# Patient Record
Sex: Female | Born: 1970 | Hispanic: Refuse to answer | Smoking: Never smoker
Health system: Southern US, Community
[De-identification: ages and names within clinical notes are randomized; demographics above are authoritative.]

## PROBLEM LIST (undated history)

## (undated) DIAGNOSIS — R0609 Other forms of dyspnea: Secondary | ICD-10-CM

## (undated) DIAGNOSIS — N809 Endometriosis, unspecified: Secondary | ICD-10-CM

---

## 2018-03-29 DIAGNOSIS — A159 Respiratory tuberculosis unspecified: Secondary | ICD-10-CM

## 2018-03-29 DIAGNOSIS — Z8611 Personal history of tuberculosis: Secondary | ICD-10-CM

## 2018-03-29 HISTORY — DX: Respiratory tuberculosis unspecified: A15.9

## 2018-03-29 HISTORY — DX: Personal history of tuberculosis: Z86.11

## 2018-11-01 ENCOUNTER — Other Ambulatory Visit: Payer: Self-pay | Admitting: Nurse Practitioner

## 2018-11-01 DIAGNOSIS — Z1231 Encounter for screening mammogram for malignant neoplasm of breast: Secondary | ICD-10-CM

## 2018-12-15 ENCOUNTER — Other Ambulatory Visit: Payer: Self-pay

## 2018-12-15 ENCOUNTER — Ambulatory Visit
Admission: RE | Admit: 2018-12-15 | Discharge: 2018-12-15 | Disposition: A | Discharge status: Home / Self Care | Payer: BC Managed Care – PPO | Source: Ambulatory Visit | Attending: Nurse Practitioner | Admitting: Nurse Practitioner

## 2018-12-15 DIAGNOSIS — Z1231 Encounter for screening mammogram for malignant neoplasm of breast: Secondary | ICD-10-CM

## 2019-07-02 ENCOUNTER — Other Ambulatory Visit: Payer: Self-pay | Admitting: Nurse Practitioner

## 2019-07-02 DIAGNOSIS — R102 Pelvic and perineal pain: Secondary | ICD-10-CM

## 2019-07-05 ENCOUNTER — Ambulatory Visit
Admission: RE | Admit: 2019-07-05 | Discharge: 2019-07-05 | Disposition: A | Discharge status: Home / Self Care | Payer: BC Managed Care – PPO | Source: Ambulatory Visit | Attending: Nurse Practitioner | Admitting: Nurse Practitioner

## 2019-07-05 DIAGNOSIS — R102 Pelvic and perineal pain: Secondary | ICD-10-CM

## 2020-03-05 ENCOUNTER — Other Ambulatory Visit: Payer: Self-pay | Admitting: Physician Assistant

## 2020-03-05 DIAGNOSIS — Z1231 Encounter for screening mammogram for malignant neoplasm of breast: Secondary | ICD-10-CM

## 2020-03-29 DIAGNOSIS — D649 Anemia, unspecified: Secondary | ICD-10-CM

## 2020-03-29 HISTORY — DX: Anemia, unspecified: D64.9

## 2020-04-16 ENCOUNTER — Ambulatory Visit: Payer: BC Managed Care – PPO

## 2020-05-01 IMAGING — MG MM DIGITAL SCREENING BILAT W/ TOMO W/ CAD
6 of 12 series · 6 of 36 positions shown · non-contrast
Comparison: None.

CLINICAL DATA: Screening.

EXAM:
DIGITAL SCREENING BILATERAL MAMMOGRAM WITH TOMO AND CAD

[R CC synth-2D (1 of 2)]
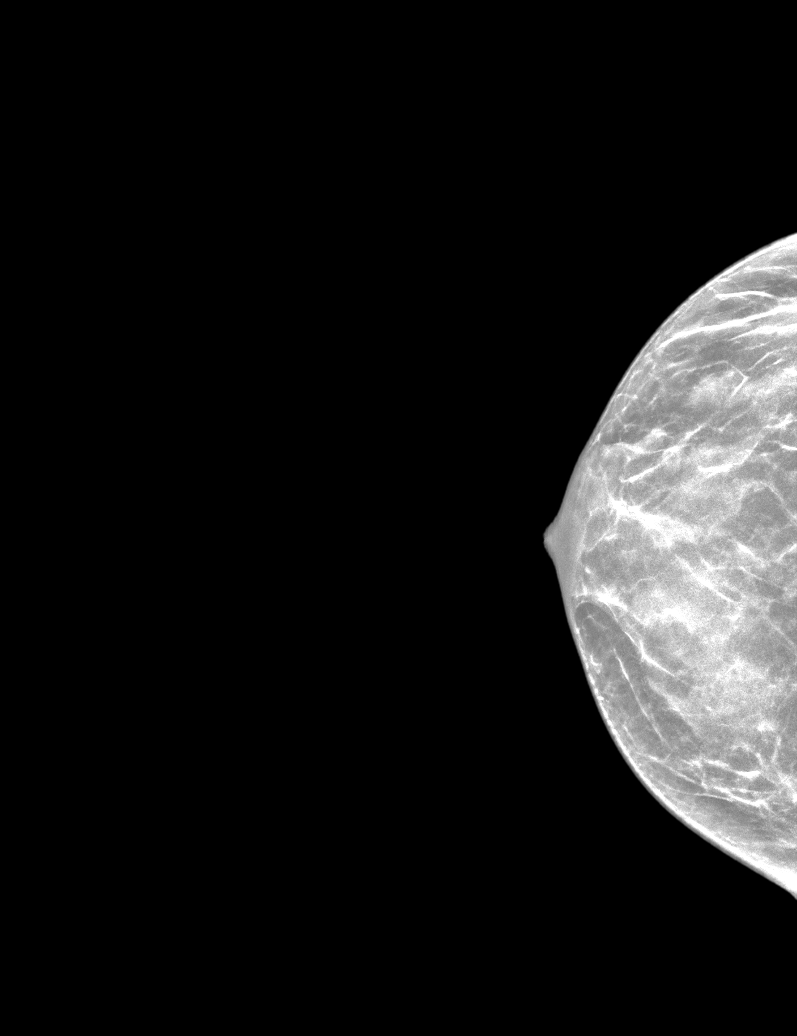

[L CC synth-2D (1 of 2)]
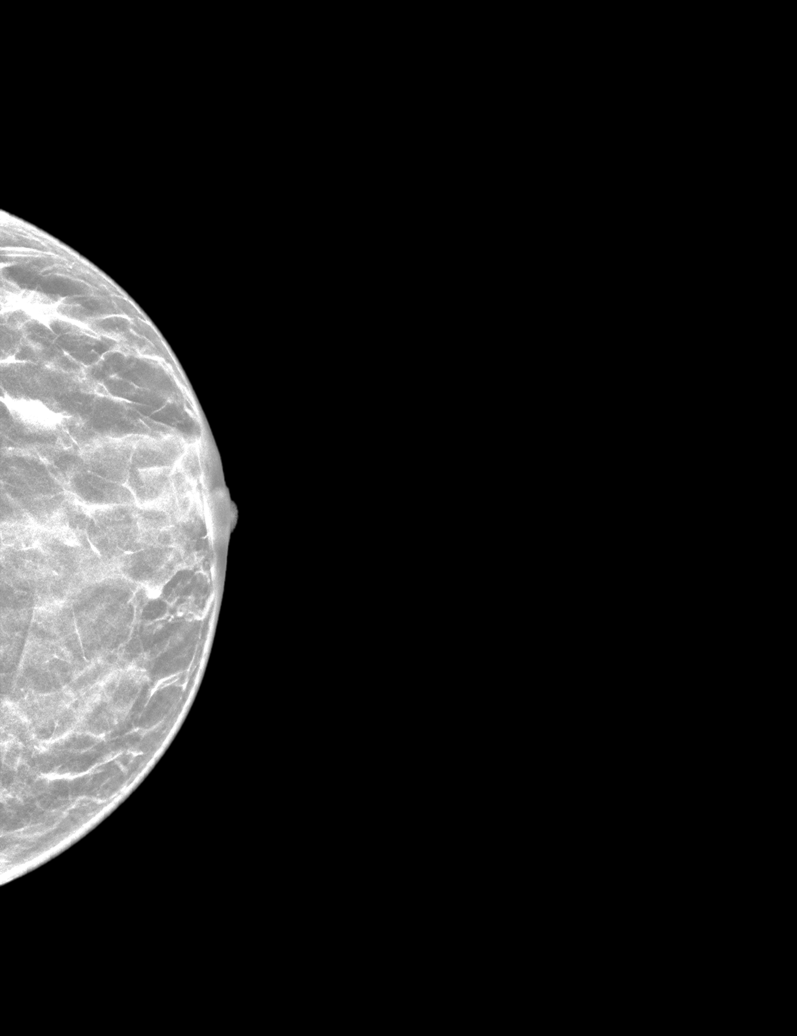

[R MLO synth-2D]
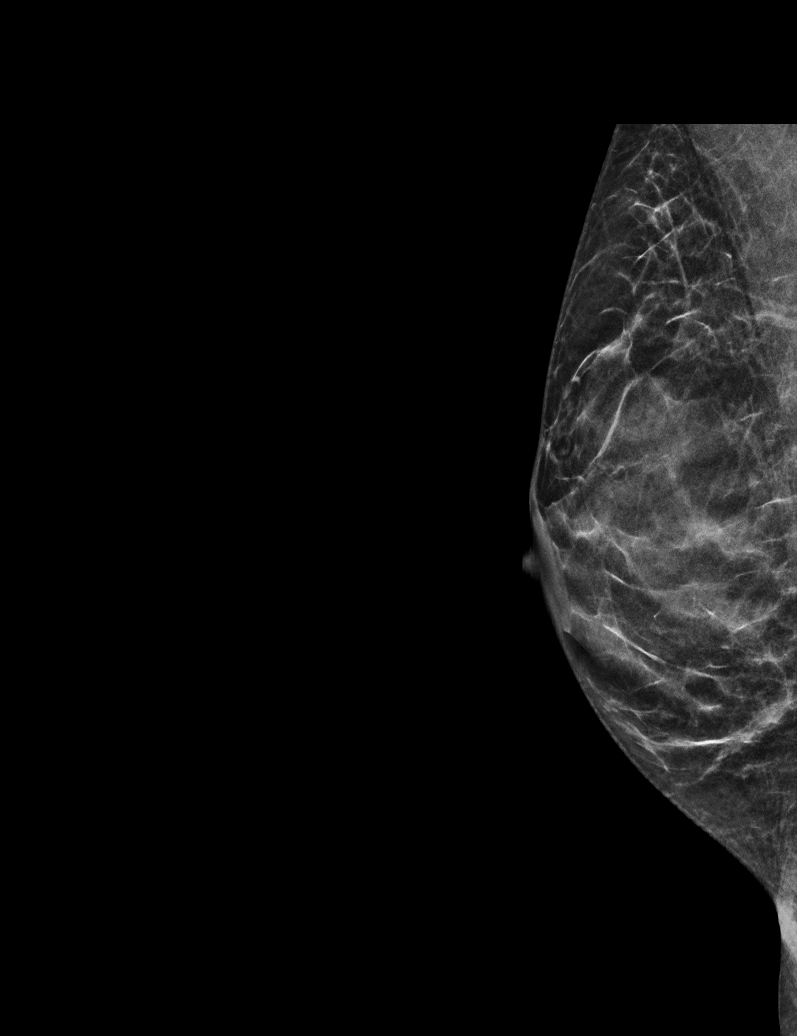

[L CC synth-2D (2 of 2)]
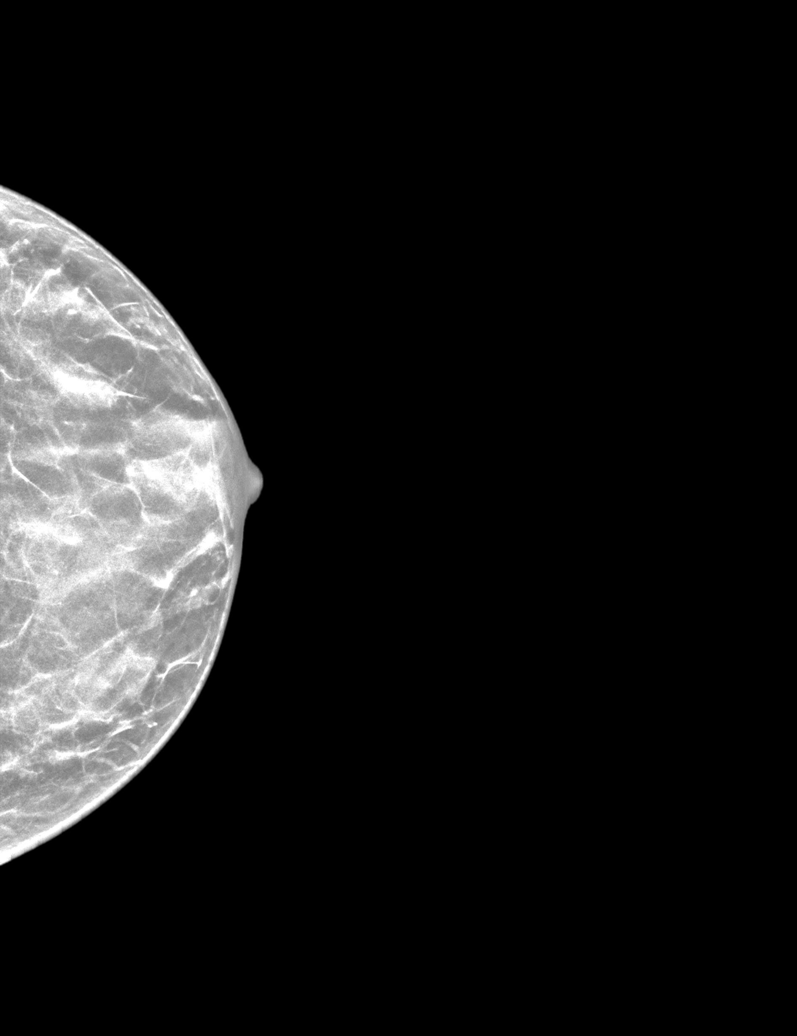

[R CC synth-2D (2 of 2)]
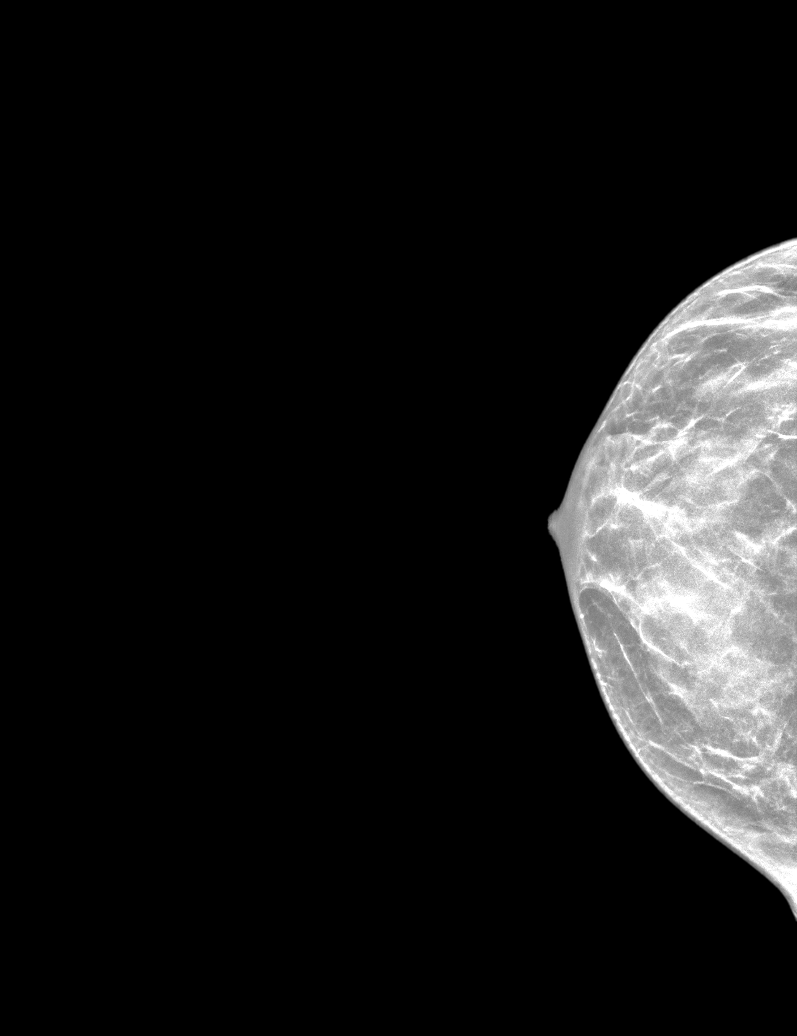

[L MLO synth-2D]
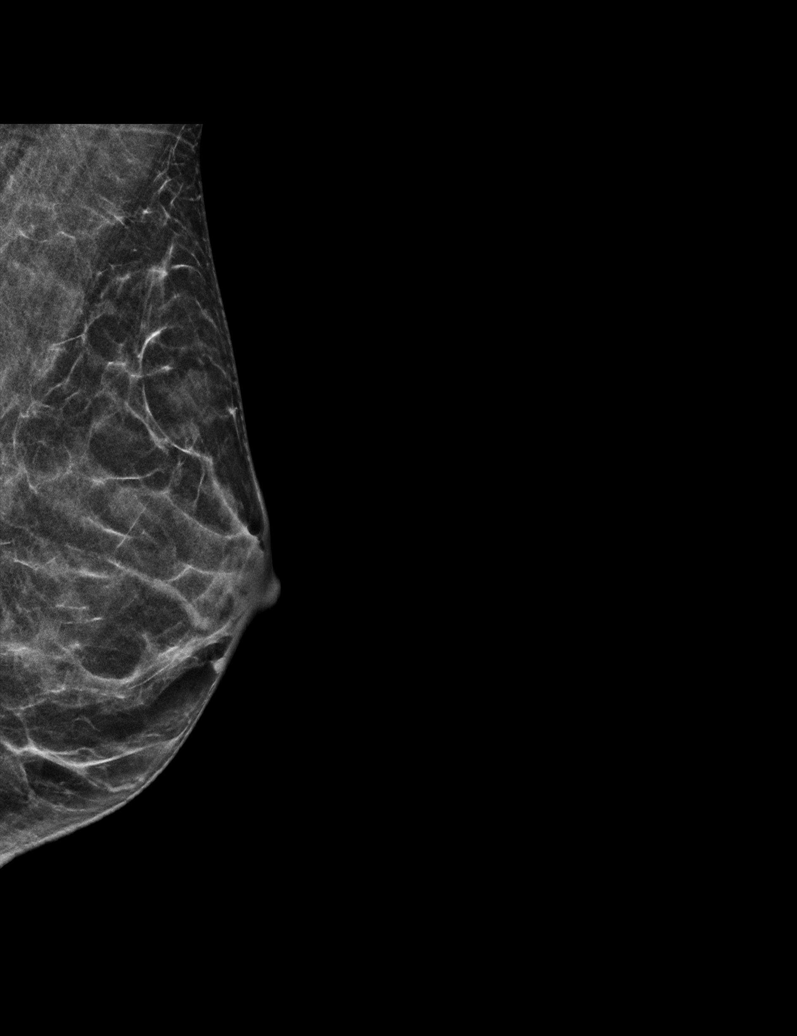

[6 of 36 positions shown; findings below may reference images not displayed]

ACR Breast Density Category c: The breast tissue is heterogeneously
dense, which may obscure small masses
FINDINGS: There are no findings suspicious for malignancy. Images were
processed with CAD.
IMPRESSION: No mammographic evidence of malignancy. A result letter of this
screening mammogram will be mailed directly to the patient.

RECOMMENDATION:
Screening mammogram in one year. (Code:EM-2-IHY)

BI-RADS CATEGORY  1: Negative.

## 2020-05-27 ENCOUNTER — Ambulatory Visit
Admission: RE | Admit: 2020-05-27 | Discharge: 2020-05-27 | Disposition: A | Discharge status: Home / Self Care | Payer: BC Managed Care – PPO | Source: Ambulatory Visit | Attending: Physician Assistant | Admitting: Physician Assistant

## 2020-05-27 ENCOUNTER — Other Ambulatory Visit: Payer: Self-pay

## 2020-05-27 DIAGNOSIS — Z1231 Encounter for screening mammogram for malignant neoplasm of breast: Secondary | ICD-10-CM

## 2020-11-19 IMAGING — US US PELVIS COMPLETE WITH TRANSVAGINAL
1 series · 13 of 25 positions shown · non-contrast
Comparison: None

CLINICAL DATA: Pelvic pain in a female, RIGHT lower quadrant pain
for a month; LMP 05/23/2019



[Series 1: us pelvis complete with transvaginal · 0.25mm/px · 13 of 81 slices shown]
[im 1/81]
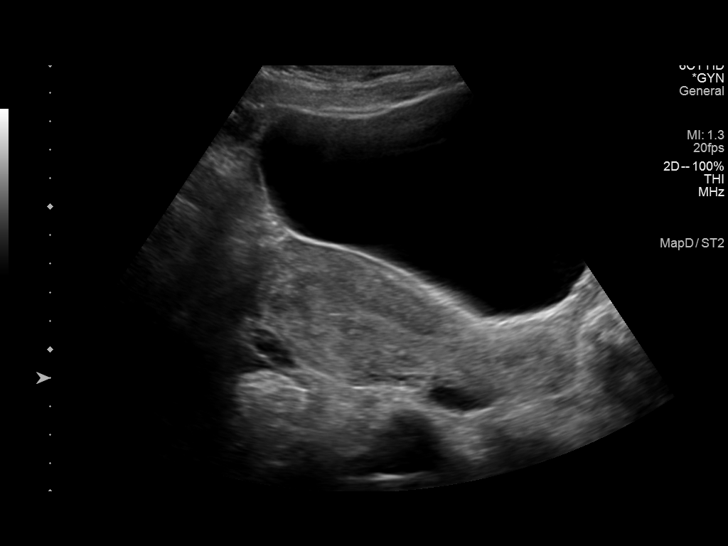
[im 7/81]
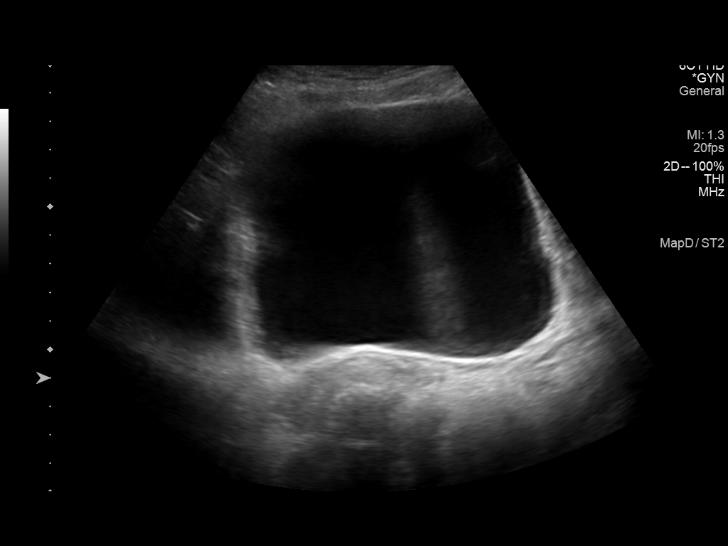
[im 14/81]
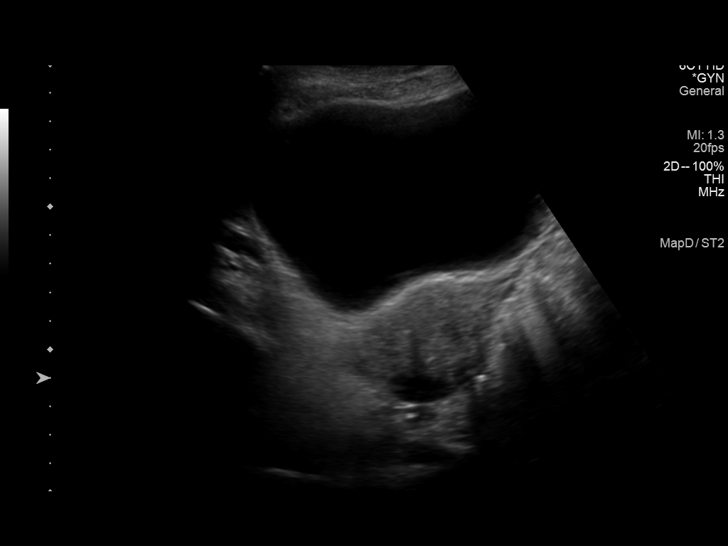
[im 21/81]
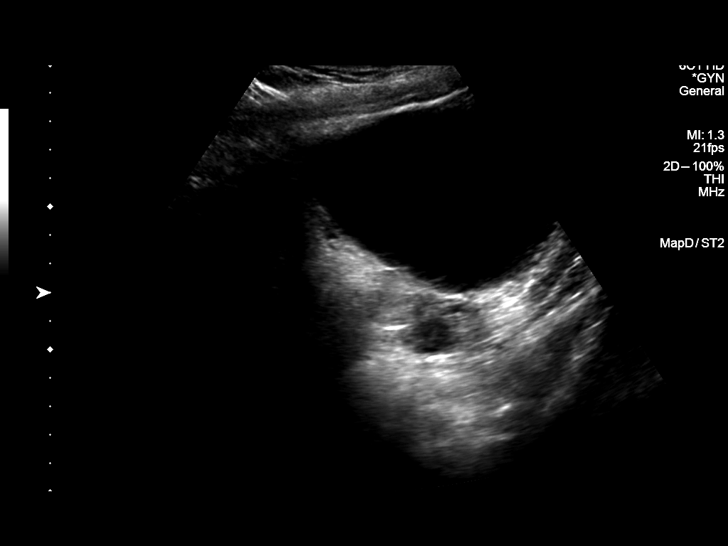
[im 27/81]
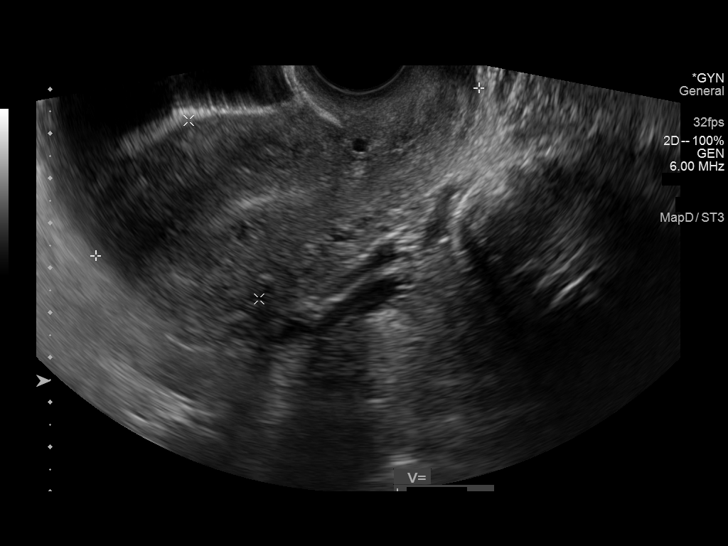
[im 34/81]
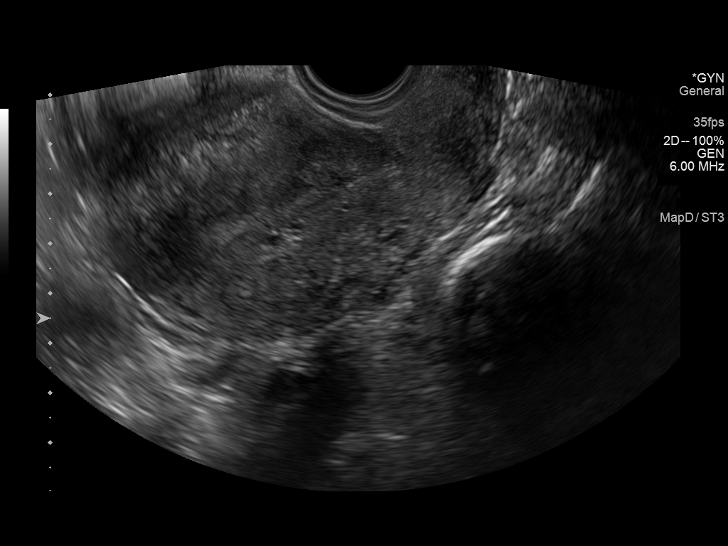
[im 41/81]
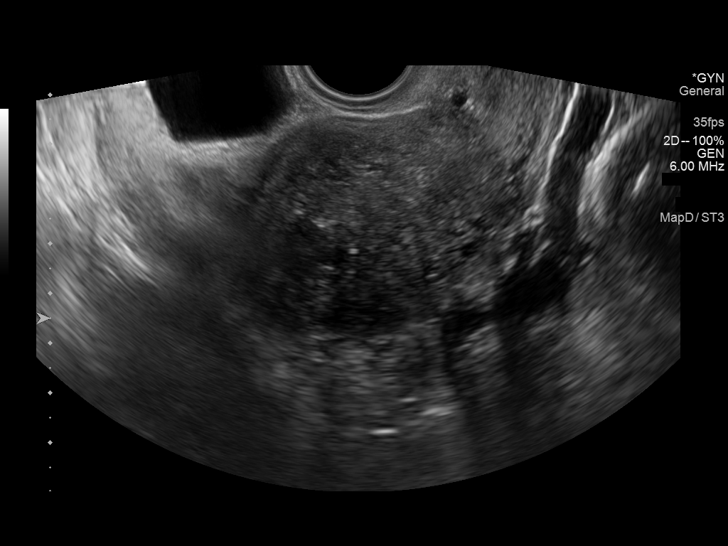
[im 47/81]
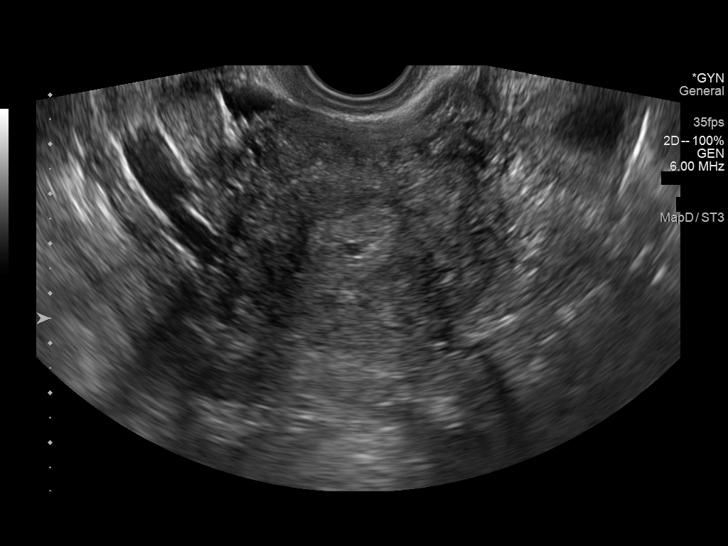
[im 54/81]
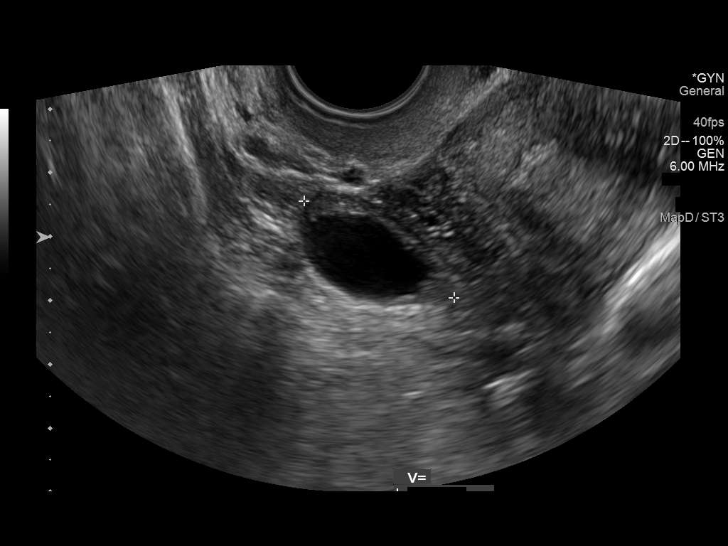
[im 61/81]
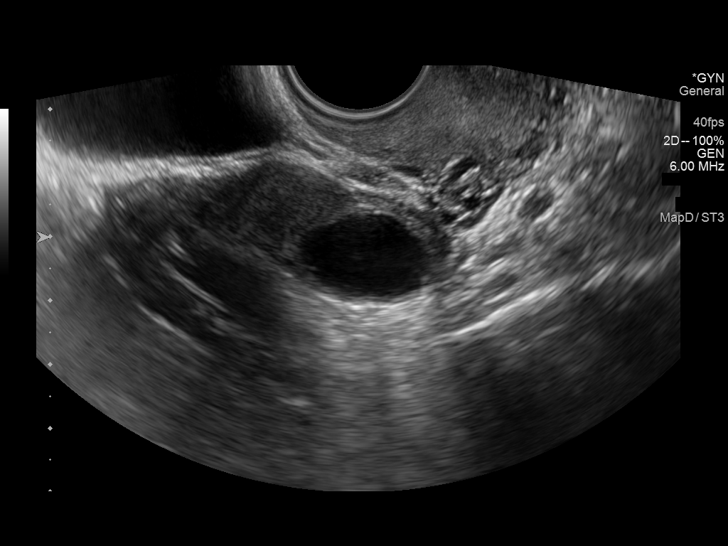
[im 67/81]
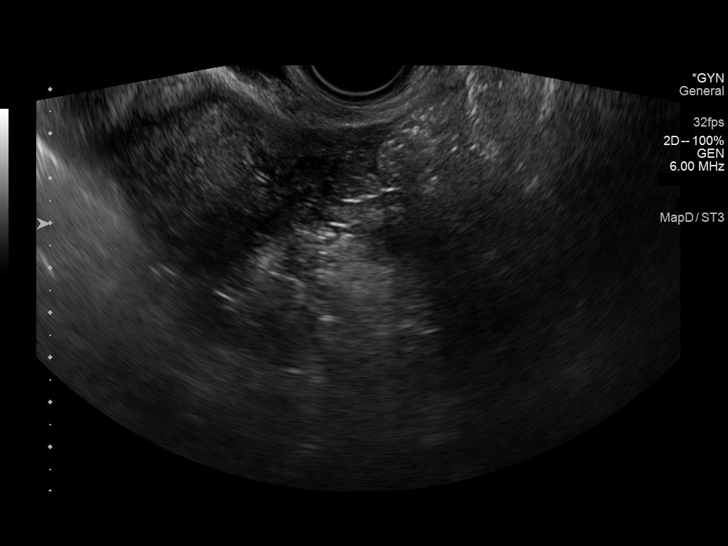
[im 74/81]
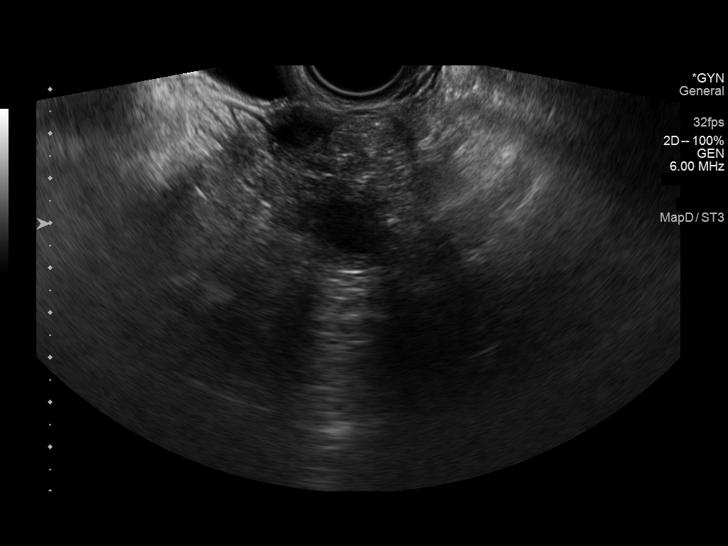
[im 81/81]
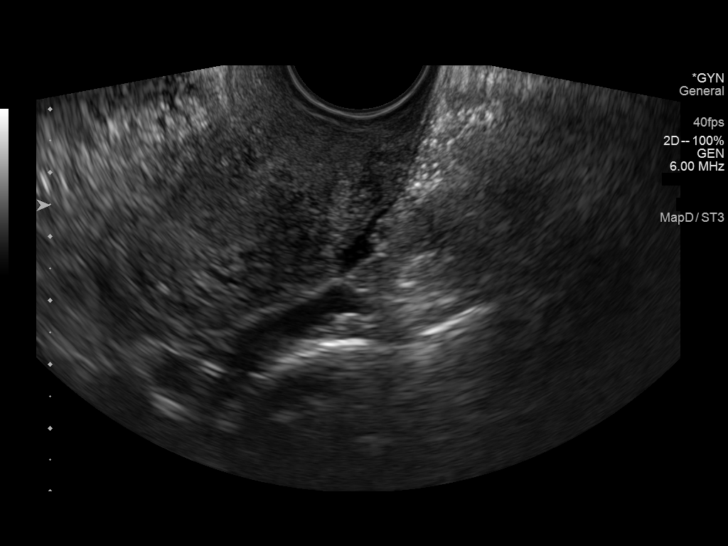

[13 of 25 positions shown; findings below may reference images not displayed]

FINDINGS: Uterus

Measurements: 11.6 x 4.2 x 6.7 cm = volume: 172 mL. Mildly
heterogeneous myometrium. Small intramural leiomyoma identified at
fundus 1.3 x 1.4 x 1.2 cm. No additional masses.

Endometrium

Thickness: 12 mm. Two tiny cysts within endometrial complex up to 3
mm diameter. No other endometrial fluid or focal mass.

Right ovary

Measurements: 3.5 x 1.8 x 2.8 cm = volume: 5.4 mL. Dominant follicle
without mass

Left ovary

Measurements: 2.1 x 1.7 x 2.4 cm = volume: 4.5 mL. Dominant follicle
without mass

Other findings

No free pelvic fluid or adnexal masses.
IMPRESSION: Single small intramural leiomyoma at upper uterus 1.4 cm greatest
size.

Two tiny nonspecific endometrial cysts.

Remainder of exam unremarkable.

## 2021-03-18 ENCOUNTER — Other Ambulatory Visit (HOSPITAL_BASED_OUTPATIENT_CLINIC_OR_DEPARTMENT_OTHER): Payer: Self-pay

## 2021-03-18 ENCOUNTER — Encounter (HOSPITAL_BASED_OUTPATIENT_CLINIC_OR_DEPARTMENT_OTHER): Payer: Self-pay

## 2021-03-18 ENCOUNTER — Emergency Department (HOSPITAL_BASED_OUTPATIENT_CLINIC_OR_DEPARTMENT_OTHER): Payer: BC Managed Care – PPO | Admitting: Radiology

## 2021-03-18 ENCOUNTER — Other Ambulatory Visit: Payer: Self-pay

## 2021-03-18 ENCOUNTER — Emergency Department (HOSPITAL_BASED_OUTPATIENT_CLINIC_OR_DEPARTMENT_OTHER)
Admission: EM | Admit: 2021-03-18 | Discharge: 2021-03-18 | Disposition: A | Discharge status: Home / Self Care | Payer: BC Managed Care – PPO | Attending: Emergency Medicine | Admitting: Emergency Medicine

## 2021-03-18 DIAGNOSIS — N898 Other specified noninflammatory disorders of vagina: Secondary | ICD-10-CM | POA: Insufficient documentation

## 2021-03-18 DIAGNOSIS — J029 Acute pharyngitis, unspecified: Secondary | ICD-10-CM | POA: Diagnosis not present

## 2021-03-18 DIAGNOSIS — U071 COVID-19: Secondary | ICD-10-CM | POA: Insufficient documentation

## 2021-03-18 DIAGNOSIS — R059 Cough, unspecified: Secondary | ICD-10-CM | POA: Diagnosis not present

## 2021-03-18 HISTORY — DX: Endometriosis, unspecified: N80.9

## 2021-03-18 LAB — COMPREHENSIVE METABOLIC PANEL
ALT: 14 U/L (ref 0–44)
AST: 21 U/L (ref 15–41)
Albumin: 4.2 g/dL (ref 3.5–5.0)
Alkaline Phosphatase: 165 U/L — ABNORMAL HIGH (ref 38–126)
Anion gap: 8 (ref 5–15)
BUN: 6 mg/dL (ref 6–20)
CO2: 24 mmol/L (ref 22–32)
Calcium: 8.8 mg/dL — ABNORMAL LOW (ref 8.9–10.3)
Chloride: 104 mmol/L (ref 98–111)
Creatinine, Ser: 0.7 mg/dL (ref 0.44–1.00)
GFR, Estimated: >60 mL/min (ref >60)
Glucose, Bld: 85 mg/dL (ref 70–99)
Potassium: 3.7 mmol/L (ref 3.5–5.1)
Sodium: 136 mmol/L (ref 135–145)
Total Bilirubin: 0.3 mg/dL (ref 0.3–1.2)
Total Protein: 7.5 g/dL (ref 6.5–8.1)

## 2021-03-18 LAB — URINALYSIS, ROUTINE W REFLEX MICROSCOPIC
Bilirubin Urine: NEGATIVE
Glucose, UA: NEGATIVE mg/dL
Hgb urine dipstick: NEGATIVE
Ketones, ur: NEGATIVE mg/dL
Leukocytes,Ua: NEGATIVE
Nitrite: NEGATIVE
Protein, ur: NEGATIVE mg/dL
Specific Gravity, Urine: 1.005 (ref 1.005–1.030)
pH: 6 (ref 5.0–8.0)

## 2021-03-18 LAB — CBC WITH DIFFERENTIAL/PLATELET
Abs Immature Granulocytes: 0.02 10*3/uL (ref 0.00–0.07)
Basophils Absolute: 0 10*3/uL (ref 0.0–0.1)
Basophils Relative: 1 %
Eosinophils Absolute: 0 10*3/uL (ref 0.0–0.5)
Eosinophils Relative: 0 %
HCT: 35.6 % — ABNORMAL LOW (ref 36.0–46.0)
Hemoglobin: 11.5 g/dL — ABNORMAL LOW (ref 12.0–15.0)
Immature Granulocytes: 0 %
Lymphocytes Relative: 9 %
Lymphs Abs: 0.5 10*3/uL — ABNORMAL LOW (ref 0.7–4.0)
MCH: 28.1 pg (ref 26.0–34.0)
MCHC: 32.3 g/dL (ref 30.0–36.0)
MCV: 87 fL (ref 80.0–100.0)
Monocytes Absolute: 0.7 10*3/uL (ref 0.1–1.0)
Monocytes Relative: 14 %
Neutro Abs: 3.8 10*3/uL (ref 1.7–7.7)
Neutrophils Relative %: 76 %
Platelets: 255 10*3/uL (ref 150–400)
RBC: 4.09 MIL/uL (ref 3.87–5.11)
RDW: 14.8 % (ref 11.5–15.5)
WBC: 5.1 10*3/uL (ref 4.0–10.5)
nRBC: 0 % (ref 0.0–0.2)

## 2021-03-18 LAB — WET PREP, GENITAL
Clue Cells Wet Prep HPF POC: NONE SEEN
Sperm: NONE SEEN
Trich, Wet Prep: NONE SEEN
WBC, Wet Prep HPF POC: <10 (ref <10)
Yeast Wet Prep HPF POC: NONE SEEN

## 2021-03-18 LAB — GROUP A STREP BY PCR: Group A Strep by PCR: NOT DETECTED

## 2021-03-18 LAB — RESP PANEL BY RT-PCR (FLU A&B, COVID) ARPGX2
Influenza A by PCR: NEGATIVE
Influenza B by PCR: NEGATIVE
SARS Coronavirus 2 by RT PCR: POSITIVE — AB

## 2021-03-18 LAB — PREGNANCY, URINE: Preg Test, Ur: NEGATIVE

## 2021-03-18 MED ORDER — DOXYCYCLINE HYCLATE 100 MG PO CAPS
100.0000 mg | ORAL_CAPSULE | Freq: Two times a day (BID) | ORAL | 0 refills | Status: AC
  Filled 2021-03-18: qty 28, 14d supply, fill #0

## 2021-03-18 MED ORDER — METRONIDAZOLE 500 MG PO TABS
500.0000 mg | ORAL_TABLET | Freq: Two times a day (BID) | ORAL | 0 refills | Status: DC
  Filled 2021-03-18: qty 14, 7d supply, fill #0

## 2021-03-18 MED ORDER — DOXYCYCLINE HYCLATE 100 MG PO TABS
100.0000 mg | ORAL_TABLET | Freq: Once | ORAL | Status: AC
  Administered 2021-03-18: 17:00:00 100 mg via ORAL
  Filled 2021-03-18: qty 1

## 2021-03-18 MED ORDER — LIDOCAINE HCL (PF) 1 % IJ SOLN
INTRAMUSCULAR | Status: AC
  Filled 2021-03-18: qty 5

## 2021-03-18 MED ORDER — METRONIDAZOLE 500 MG PO TABS
500.0000 mg | ORAL_TABLET | Freq: Once | ORAL | Status: AC
  Administered 2021-03-18: 17:00:00 500 mg via ORAL
  Filled 2021-03-18: qty 1

## 2021-03-18 MED ORDER — CEFTRIAXONE SODIUM 500 MG IJ SOLR
500.0000 mg | Freq: Once | INTRAMUSCULAR | Status: AC
  Administered 2021-03-18: 17:00:00 500 mg via INTRAMUSCULAR
  Filled 2021-03-18: qty 500

## 2021-03-18 NOTE — ED Notes (Signed)
Dc instructions reviewed with patient. Patient voiced understanding. Dc with belongings.  °

## 2021-03-18 NOTE — Discharge Instructions (Addendum)
You have been seen in the ED and have tested positive for COVID 19. Please quarantine yourself for 5 days following symptom onset. That should be until Saturday for you. Please return to the ED if you do not start improve and continue to worsen. I also suspect that you have an infection in your pelvic region. I have sent you home with 14 days of antibiotics. Please follow up with your OBGYN over the next few weeks.

## 2021-03-18 NOTE — ED Triage Notes (Signed)
Pt c/o sore throat and cough that started last night. Pt also states that she has a vaginal discharge that was noted after her period ended. 97.

## 2021-03-18 NOTE — ED Provider Notes (Signed)
[facility] EMERGENCY DEPT Provider Note   CSN: [id number] Arrival date & time: 03/18/21  1426     History Chief Complaint  Patient presents with   Sore Throat   Cough    Crystal Atkins is a 50 y.o. female. Patient presents with 1 day of cough and sore throat.  Started yesterday.  She has a productive cough with yellow sputum.  She also endorses having a scratchy throat.  She has having some difficulty with swallowing and feels as though her throat feels tight.  She denies any shortness of breath, or chest pain. She also has complaints of vaginal discharge that started today.  She had the last day of her period yesterday.  She does not normally have vaginal discharge following her periods.  She describes it as being white.  She denies any itchiness.  She says she is not sexually active at the moment.  No fevers or chills.    Sore Throat  Cough Associated symptoms: sore throat       Past Medical History:  Diagnosis Date   Endometriosis    History of TB (tuberculosis) 2020    There are no problems to display for this patient.   Past Surgical History:  Procedure Laterality Date   CESAREAN SECTION       OB History   No obstetric history on file.     No family history on file.  Social History   Tobacco Use   Smoking status: Never   Smokeless tobacco: Never  Vaping Use   Vaping Use: Never used  Substance Use Topics   Alcohol use: Never   Drug use: Never    Home Medications Prior to Admission medications   Medication Sig Start Date End Date Taking? Authorizing Provider  doxycycline (VIBRAMYCIN) 100 MG capsule Take 1 capsule (100 mg total) by mouth 2 (two) times daily for 14 days. 03/18/21 04/01/21 Yes Dezi Schaner, Adora Fridge, PA-C  metroNIDAZOLE (FLAGYL) 500 MG tablet Take 1 tablet (500 mg total) by mouth 2 (two) times daily. 03/18/21  Yes Bentli Llorente, Adora Fridge, PA-C    Allergies    Patient has no known allergies.  Review of Systems   Review of  Systems  HENT:  Positive for sore throat.   Respiratory:  Positive for cough.   Genitourinary:  Positive for vaginal discharge.  All other systems reviewed and are negative.  Physical Exam Updated Vital Signs BP 127/87    Pulse (!) 105    Temp 97.8 F (36.6 C) (Oral)    Resp 20    Ht 5\' 2"  (1.575 m)    Wt 61.9 kg    LMP 03/12/2021    SpO2 100%    BMI 24.96 kg/m   Physical Exam Vitals and nursing note reviewed. Exam conducted with a chaperone present.  Constitutional:      General: She is not in acute distress.    Appearance: Normal appearance. She is not ill-appearing, toxic-appearing or diaphoretic.  HENT:     Head: Normocephalic and atraumatic.     Right Ear: Tympanic membrane, ear canal and external ear normal. There is no impacted cerumen.     Left Ear: Tympanic membrane, ear canal and external ear normal. There is no impacted cerumen.     Nose: Nose normal. No nasal deformity, congestion or rhinorrhea.     Mouth/Throat:     Lips: Pink. No lesions.     Mouth: Mucous membranes are moist. No injury, lacerations, oral lesions or angioedema.  Pharynx: Oropharynx is clear. Uvula midline. Posterior oropharyngeal erythema present. No pharyngeal swelling, oropharyngeal exudate or uvula swelling.     Tonsils: No tonsillar exudate or tonsillar abscesses.     Comments: Minimal posterior pharynx erythema with no exudates. Uvula midline. Eyes:     General: Gaze aligned appropriately. No scleral icterus.       Right eye: No discharge.        Left eye: No discharge.     Conjunctiva/sclera: Conjunctivae normal.     Right eye: Right conjunctiva is not injected. No exudate or hemorrhage.    Left eye: Left conjunctiva is not injected. No exudate or hemorrhage. Cardiovascular:     Rate and Rhythm: Regular rhythm. Tachycardia present.     Pulses: Normal pulses.          Radial pulses are 2+ on the right side and 2+ on the left side.       Dorsalis pedis pulses are 2+ on the right side and 2+  on the left side.     Heart sounds: Normal heart sounds, S1 normal and S2 normal. Heart sounds not distant. No murmur heard.   No friction rub. No gallop. No S3 or S4 sounds.  Pulmonary:     Effort: Pulmonary effort is normal. No accessory muscle usage or respiratory distress.     Breath sounds: Normal breath sounds. No stridor. No wheezing, rhonchi or rales.  Chest:     Chest wall: No tenderness.  Abdominal:     General: Abdomen is flat. Bowel sounds are normal. There is no distension.     Palpations: Abdomen is soft. There is no mass or pulsatile mass.     Tenderness: There is abdominal tenderness in the suprapubic area. There is no guarding or rebound.     Comments: Moderate ttp of suprapubic region. Patient jumped when I palpated this  Genitourinary:    Labia:        Right: No tenderness or lesion.        Left: No tenderness or lesion.      Vagina: Normal.     Cervix: Cervical motion tenderness and discharge present. No lesion, erythema or cervical bleeding.     Uterus: Normal.      Adnexa: Right adnexa normal and left adnexa normal.       Right: No mass, tenderness or fullness.         Left: No mass, tenderness or fullness.    Musculoskeletal:     Cervical back: Neck supple. No tenderness.     Right lower leg: No edema.     Left lower leg: No edema.  Lymphadenopathy:     Cervical: No cervical adenopathy.  Skin:    General: Skin is warm and dry.     Coloration: Skin is not jaundiced or pale.     Findings: No bruising, erythema, lesion or rash.  Neurological:     General: No focal deficit present.     Mental Status: She is alert and oriented to person, place, and time.     GCS: GCS eye subscore is 4. GCS verbal subscore is 5. GCS motor subscore is 6.  Psychiatric:        Mood and Affect: Mood normal.        Behavior: Behavior normal. Behavior is cooperative.    ED Results / Procedures / Treatments   Labs (all labs ordered are listed, but only abnormal results are  displayed) Labs Reviewed  RESP PANEL BY RT-PCR (  FLU A&B, COVID) ARPGX2 - Abnormal; Notable for the following components:      Result Value   SARS Coronavirus 2 by RT PCR POSITIVE (*)    All other components within normal limits  COMPREHENSIVE METABOLIC PANEL - Abnormal; Notable for the following components:   Calcium 8.8 (*)    Alkaline Phosphatase 165 (*)    All other components within normal limits  CBC WITH DIFFERENTIAL/PLATELET - Abnormal; Notable for the following components:   Hemoglobin 11.5 (*)    HCT 35.6 (*)    Lymphs Abs 0.5 (*)    All other components within normal limits  URINALYSIS, ROUTINE W REFLEX MICROSCOPIC - Abnormal; Notable for the following components:   Color, Urine COLORLESS (*)    All other components within normal limits  GROUP A STREP BY PCR  WET PREP, GENITAL  PREGNANCY, URINE  GC/CHLAMYDIA PROBE AMP ([place]) NOT AT [facility]    EKG None  Radiology DG Chest 2 View  Result Date: 03/18/2021 CLINICAL DATA:  Cough EXAM: CHEST - 2 VIEW COMPARISON:  None FINDINGS: Cardiac and mediastinal contours are within normal limits. Bilateral left greater than right coarse reticular nodular opacities. No evidence of pleural effusion or pneumothorax. Prior left clavicular fracture. IMPRESSION: Bilateral left greater than right coarse reticulonodular opacities, possibly due to underlying chronic lung disease. Atypical infectious process is an additional consideration. Recommend chest CT for further evaluation. Electronically Signed   By: Yetta Glassman M.D.   On: 03/18/2021 16:44    Procedures Procedures   Medications Ordered in ED Medications  cefTRIAXone (ROCEPHIN) injection 500 mg (500 mg Intramuscular Given 03/18/21 1713)  metroNIDAZOLE (FLAGYL) tablet 500 mg (500 mg Oral Given 03/18/21 1716)  doxycycline (VIBRA-TABS) tablet 100 mg (100 mg Oral Given 03/18/21 1716)  lidocaine (PF) (XYLOCAINE) 1 % injection (  Given 03/18/21 1717)    ED Course  I have  reviewed the triage vital signs and the nursing notes.  Pertinent labs & imaging results that were available during my care of the patient were reviewed by me and considered in my medical decision making (see chart for details).  Clinical Course as of 03/18/21 1848  Wed Mar 18, 2021  1601 SARS Coronavirus 2 by RT PCR(!): POSITIVE [GL]    Clinical Course User Index [GL] Mame Twombly, Adora Fridge, PA-C   MDM Rules/Calculators/A&P                          Patient comes in with 1 day of upper respiratory symptoms noting productive cough and sore throat.  She is tachycardic to 108.  Exam with unrevealing HEENT. Patient is endorsing some throat tightness and difficulty swallowing. May be developing bronchitis, laryngitis, or pharyngitis. No evidence of PTA or RPA. Strep, COVID/Flu pending. - Positive for COVID-19 - CXR revealed bilateral left greater than right coarse reticulonodular opacities, possibly due to underlying chronic lung disease. Atypical infectious process is an additional consideration. Recommends Chest CT for further evaluation. Given that patient has acute respiratory process, this could be an atypical PNA. Patient will be placed on abx for PID which will also cover for a PNA. She needs to follow up outpatient for further evaluation of this finding.   Patient also presents with 1 day of vaginal discharge. She has associated moderate pelvic ttp on exam that patient states is also new. Plan to perform pelvic exam with G/C, wet prep, UA, and basic labs.  1618: Pelvic exam with some discomfort.  Patient does have cervical motion tenderness.  Vaginal discharge is white and minimal. Wet prep without evidence of BV, yeast, or trichomonas. G/C pending. Will treat ppx for PID given that clinically she has pelvic pain with new vaginal discharge along with CMT.  COVID symptomatic relief at home. Patient stable for discharge. Follow up with PCP and OBGYN. Return precautions provided.   Final Clinical  Impression(s) / ED Diagnoses Final diagnoses:  Vaginal discharge  COVID-19    Rx / DC Orders ED Discharge Orders          Ordered    doxycycline (VIBRAMYCIN) 100 MG capsule  2 times daily        03/18/21 1656    metroNIDAZOLE (FLAGYL) 500 MG tablet  2 times daily        03/18/21 1656             Barby Colvard, Cherlyn Roberts 03/18/21 1849    Fredia Sorrow, MD 03/19/21 [phone]

## 2021-03-19 ENCOUNTER — Other Ambulatory Visit (HOSPITAL_BASED_OUTPATIENT_CLINIC_OR_DEPARTMENT_OTHER): Payer: Self-pay

## 2021-03-19 ENCOUNTER — Telehealth (HOSPITAL_BASED_OUTPATIENT_CLINIC_OR_DEPARTMENT_OTHER): Payer: Self-pay

## 2021-03-19 LAB — GC/CHLAMYDIA PROBE AMP (~~LOC~~) NOT AT ARMC
Chlamydia: NEGATIVE
Comment: NEGATIVE
Comment: NORMAL
Neisseria Gonorrhea: NEGATIVE

## 2021-03-19 NOTE — ED Notes (Signed)
Opened chart at pts request, states medication is making her throw up. Instructed that she may return for evaluation or follow up with her PCP.

## 2021-03-31 ENCOUNTER — Other Ambulatory Visit (HOSPITAL_BASED_OUTPATIENT_CLINIC_OR_DEPARTMENT_OTHER): Payer: Self-pay

## 2021-07-13 DIAGNOSIS — N8003 Adenomyosis of the uterus: Secondary | ICD-10-CM | POA: Diagnosis not present

## 2021-07-13 DIAGNOSIS — N920 Excessive and frequent menstruation with regular cycle: Secondary | ICD-10-CM | POA: Diagnosis not present

## 2021-07-13 DIAGNOSIS — Z01419 Encounter for gynecological examination (general) (routine) without abnormal findings: Secondary | ICD-10-CM | POA: Diagnosis not present

## 2021-07-13 DIAGNOSIS — Z130 Encounter for screening for diseases of the blood and blood-forming organs and certain disorders involving the immune mechanism: Secondary | ICD-10-CM | POA: Diagnosis not present

## 2021-07-20 DIAGNOSIS — N920 Excessive and frequent menstruation with regular cycle: Secondary | ICD-10-CM | POA: Diagnosis not present

## 2021-07-20 DIAGNOSIS — N939 Abnormal uterine and vaginal bleeding, unspecified: Secondary | ICD-10-CM | POA: Diagnosis not present

## 2021-07-20 DIAGNOSIS — N840 Polyp of corpus uteri: Secondary | ICD-10-CM | POA: Diagnosis not present

## 2021-07-20 DIAGNOSIS — N8003 Adenomyosis of the uterus: Secondary | ICD-10-CM | POA: Diagnosis not present

## 2021-07-29 ENCOUNTER — Encounter (HOSPITAL_BASED_OUTPATIENT_CLINIC_OR_DEPARTMENT_OTHER): Payer: Self-pay | Admitting: Obstetrics and Gynecology

## 2021-07-29 DIAGNOSIS — N920 Excessive and frequent menstruation with regular cycle: Secondary | ICD-10-CM

## 2021-07-29 DIAGNOSIS — N8003 Adenomyosis of the uterus: Secondary | ICD-10-CM

## 2021-07-29 HISTORY — DX: Excessive and frequent menstruation with regular cycle: N92.0

## 2021-07-29 HISTORY — DX: Adenomyosis of the uterus: N80.03

## 2021-08-20 DIAGNOSIS — N926 Irregular menstruation, unspecified: Secondary | ICD-10-CM | POA: Diagnosis not present

## 2021-08-20 DIAGNOSIS — N939 Abnormal uterine and vaginal bleeding, unspecified: Secondary | ICD-10-CM | POA: Diagnosis not present

## 2021-08-20 DIAGNOSIS — N920 Excessive and frequent menstruation with regular cycle: Secondary | ICD-10-CM | POA: Diagnosis not present

## 2021-08-20 DIAGNOSIS — Z130 Encounter for screening for diseases of the blood and blood-forming organs and certain disorders involving the immune mechanism: Secondary | ICD-10-CM | POA: Diagnosis not present

## 2021-08-20 DIAGNOSIS — Z01818 Encounter for other preprocedural examination: Secondary | ICD-10-CM | POA: Diagnosis not present

## 2021-08-26 ENCOUNTER — Encounter (HOSPITAL_BASED_OUTPATIENT_CLINIC_OR_DEPARTMENT_OTHER): Payer: Self-pay | Admitting: Obstetrics and Gynecology

## 2021-08-26 ENCOUNTER — Other Ambulatory Visit: Payer: Self-pay

## 2021-08-26 NOTE — Progress Notes (Deleted)
Your procedure is scheduled on Thursday, 09/03/2021.  Report to [facility] AT  6:00 A. M.   Call this number if you have problems the morning of surgery  :[phone].   OUR ADDRESS IS [address].  WE ARE LOCATED IN [facility].  PLEASE BRING YOUR INSURANCE CARD AND PHOTO ID DAY OF SURGERY.  ONLY 2 PEOPLE ARE ALLOWED IN  WAITING  ROOM.                                      REMEMBER:  DO NOT EAT FOOD, CANDY GUM OR MINTS  AFTER MIDNIGHT THE NIGHT BEFORE YOUR SURGERY . YOU MAY HAVE CLEAR LIQUIDS FROM MIDNIGHT THE NIGHT BEFORE YOUR SURGERY UNTIL  5:00 AM. NO CLEAR LIQUIDS AFTER  5:00 AM DAY OF SURGERY.  YOU MAY  BRUSH YOUR TEETH MORNING OF SURGERY AND RINSE YOUR MOUTH OUT, NO CHEWING GUM CANDY OR MINTS.     CLEAR LIQUID DIET   Foods Allowed                                                                     Foods Excluded  Coffee and tea, regular and decaf                             liquids that you cannot  Plain Jell-O                                                                   see through such as: Fruit ices (not with fruit pulp)                                     milk, soups, orange juice  Plain  Popsicles                                    All solid food Carbonated beverages, regular and diet                                    Cranberry, grape and apple juices Sports drinks like Gatorade _____________________________________________________________________     TAKE THESE MEDICATIONS MORNING OF SURGERY: Norethindrone    UP TO 4 VISITORS  MAY VISIT IN THE EXTENDED RECOVERY ROOM UNTIL 800 PM ONLY.  1 VISITOR AGE 10 AND OVER MAY SPEND THE NIGHT AND MUST BE IN EXTENDED RECOVERY ROOM NO LATER THAN 800 PM . YOUR DISCHARGE TIME AFTER YOU SPEND THE NIGHT IS 900 AM THE MORNING AFTER YOUR SURGERY.  YOU MAY PACK A SMALL OVERNIGHT BAG WITH TOILETRIES FOR YOUR OVERNIGHT STAY IF YOU WISH.  YOUR PRESCRIPTION MEDICATIONS WILL BE  PROVIDED DURING [place].                                      DO NOT WEAR JEWERLY, MAKE UP. DO NOT WEAR LOTIONS, POWDERS, PERFUMES OR NAIL POLISH ON YOUR FINGERNAILS. TOENAIL POLISH IS OK TO WEAR. DO NOT SHAVE FOR 48 HOURS PRIOR TO DAY OF SURGERY. MEN MAY SHAVE FACE AND NECK. CONTACTS, GLASSES, OR DENTURES MAY NOT BE WORN TO SURGERY.  REMEMBER: NO SMOKING, DRUGS OR ALCOHOL FOR 24 HOURS BEFORE YOUR SURGERY.                                    [facility] IS NOT RESPONSIBLE  FOR ANY BELONGINGS.                                                                    Marland Kitchen           [place] - Preparing for Surgery Before surgery, you can play an important role.  Because skin is not sterile, your skin needs to be as free of germs as possible.  You can reduce the number of germs on your skin by washing with CHG (chlorahexidine gluconate) soap before surgery.  CHG is an antiseptic cleaner which kills germs and bonds with the skin to continue killing germs even after washing. Please DO NOT use if you have an allergy to CHG or antibacterial soaps.  If your skin becomes reddened/irritated stop using the CHG and inform your nurse when you arrive at Short Stay. Do not shave (including legs and underarms) for at least 48 hours prior to the first CHG shower.  You may shave your face/neck. Please follow these instructions carefully:  1.  Shower with CHG Soap the night before surgery and the  morning of Surgery.  2.  If you choose to wash your hair, wash your hair first as usual with your  normal  shampoo.  3.  After you shampoo, rinse your hair and body thoroughly to remove the  shampoo.                            4.  Use CHG as you would any other liquid soap.  You can apply chg directly  to the skin and wash , please wash your belly button thoroughly with chg soap provided night before and morning of your surgery.                     Gently with a scrungie or clean washcloth.  5.  Apply the CHG Soap  to your body ONLY FROM THE NECK DOWN.   Do not use on face/ open                           Wound or open sores. Avoid contact with eyes, ears mouth and genitals (private parts).  Wash face,  Genitals (private parts) with your normal soap.             6.  Wash thoroughly, paying special attention to the area where your surgery  will be performed.  7.  Thoroughly rinse your body with warm water from the neck down.  8.  DO NOT shower/wash with your normal soap after using and rinsing off  the CHG Soap.                9.  Pat yourself dry with a clean towel.            10.  Wear clean pajamas.            11.  Place clean sheets on your bed the night of your first shower and do not  sleep with pets. Day of Surgery : Do not apply any lotions/deodorants the morning of surgery.  Please wear clean clothes to the hospital/surgery center.  IF YOU HAVE ANY SKIN IRRITATION OR PROBLEMS WITH THE SURGICAL SOAP, PLEASE GET A BAR OF GOLD DIAL SOAP AND SHOWER THE NIGHT BEFORE YOUR SURGERY AND THE MORNING OF YOUR SURGERY. PLEASE LET THE NURSE KNOW MORNING OF YOUR SURGERY IF YOU HAD ANY PROBLEMS WITH THE SURGICAL SOAP.   ________________________________________________________________________                                                        QUESTIONS [facility] Falling PRE OP NURSE PHONE [phone].

## 2021-08-26 NOTE — Progress Notes (Signed)
Your procedure is scheduled on Thursday, 09/03/2021.  Report to [facility] AT  6:00 A. M.   Call this number if you have problems the morning of surgery  :[phone].   OUR ADDRESS IS [place].  [facility] ARE [facility]ATED [facility].  PLEASE BRING YOUR INSURANCE CARD AND PHOTO ID DAY OF SURGERY.  ONLY 2 PEOPLE ARE ALLOWED IN  WAITING  ROOM.                                      REMEMBER:  DO NOT EAT FOOD, CANDY GUM OR MINTS  AFTER MIDNIGHT THE NIGHT BEFORE YOUR SURGERY . YOU MAY HAVE CLEAR LIQUIDS FROM MIDNIGHT THE NIGHT BEFORE YOUR SURGERY UNTIL  5:00 AM. NO CLEAR LIQUIDS AFTER  5:00 AM DAY OF SURGERY.  YOU MAY  BRUSH YOUR TEETH MORNING OF SURGERY AND RINSE YOUR MOUTH OUT, NO CHEWING GUM CANDY OR MINTS.     CLEAR LIQUID DIET   Foods Allowed                                                                     Foods Excluded  Coffee and tea, regular and decaf                             liquids that you cannot  Plain Jell-O                                                                   see through such as: Fruit ices (not with fruit pulp)                                     milk, soups, orange juice  Plain  Popsicles                                    All solid food Carbonated beverages, regular and diet                                    Cranberry, grape and apple juices Sports drinks like Gatorade _____________________________________________________________________     TAKE THESE MEDICATIONS MORNING OF SURGERY: Norethindrone   UP TO 4 VISITORS  MAY VISIT IN THE EXTENDED RECOVERY ROOM UNTIL 800 PM ONLY.  1 VISITOR AGE 70 AND OVER MAY SPEND THE NIGHT AND MUST BE IN EXTENDED RECOVERY ROOM NO LATER THAN 800 PM . YOUR DISCHARGE TIME AFTER YOU SPEND THE NIGHT IS 900 AM THE MORNING AFTER YOUR SURGERY.  YOU MAY PACK A SMALL OVERNIGHT BAG WITH TOILETRIES FOR YOUR OVERNIGHT STAY IF YOU WISH.  YOUR PRESCRIPTION MEDICATIONS WILL BE PROVIDED  DURING [place].                                      DO NOT WEAR JEWERLY, MAKE UP. DO NOT WEAR LOTIONS, POWDERS, PERFUMES OR NAIL POLISH ON YOUR FINGERNAILS. TOENAIL POLISH IS OK TO WEAR. DO NOT SHAVE FOR 48 HOURS PRIOR TO DAY OF SURGERY. MEN MAY SHAVE FACE AND NECK. CONTACTS, GLASSES, OR DENTURES MAY NOT BE WORN TO SURGERY.  REMEMBER: NO SMOKING, DRUGS OR ALCOHOL FOR 24 HOURS BEFORE YOUR SURGERY.                                    [place] IS NOT RESPONSIBLE  FOR ANY BELONGINGS.                                                                    Marland Kitchen           [place] - Preparing for Surgery Before surgery, you can play an important role.  Because skin is not sterile, your skin needs to be as free of germs as possible.  You can reduce the number of germs on your skin by washing with CHG (chlorahexidine gluconate) soap before surgery.  CHG is an antiseptic cleaner which kills germs and bonds with the skin to continue killing germs even after washing. Please DO NOT use if you have an allergy to CHG or antibacterial soaps.  If your skin becomes reddened/irritated stop using the CHG and inform your nurse when you arrive at Short Stay. Do not shave (including legs and underarms) for at least 48 hours prior to the first CHG shower.  You may shave your face/neck. Please follow these instructions carefully:  1.  Shower with CHG Soap the night before surgery and the  morning of Surgery.  2.  If you choose to wash your hair, wash your hair first as usual with your  normal  shampoo.  3.  After you shampoo, rinse your hair and body thoroughly to remove the  shampoo.                            4.  Use CHG as you would any other liquid soap.  You can apply chg directly  to the skin and wash , please wash your belly button thoroughly with chg soap provided night before and morning of your surgery.                     Gently with a scrungie or clean washcloth.  5.  Apply the CHG Soap to your  body ONLY FROM THE NECK DOWN.   Do not use on face/ open                           Wound or open sores. Avoid contact with eyes, ears mouth and genitals (private parts).  Wash face,  Genitals (private parts) with your normal soap.             6.  Wash thoroughly, paying special attention to the area where your surgery  will be performed.  7.  Thoroughly rinse your body with warm water from the neck down.  8.  DO NOT shower/wash with your normal soap after using and rinsing off  the CHG Soap.                9.  Pat yourself dry with a clean towel.            10.  Wear clean pajamas.            11.  Place clean sheets on your bed the night of your first shower and do not  sleep with pets. Day of Surgery : Do not apply any lotions/deodorants the morning of surgery.  Please wear clean clothes to the hospital/surgery center.  IF YOU HAVE ANY SKIN IRRITATION OR PROBLEMS WITH THE SURGICAL SOAP, PLEASE GET A BAR OF GOLD DIAL SOAP AND SHOWER THE NIGHT BEFORE YOUR SURGERY AND THE MORNING OF YOUR SURGERY. PLEASE LET THE NURSE KNOW MORNING OF YOUR SURGERY IF YOU HAD ANY PROBLEMS WITH THE SURGICAL SOAP.   ________________________________________________________________________                                                        QUESTIONS [place] Falling PRE OP NURSE PHONE [phone].

## 2021-08-26 NOTE — Progress Notes (Signed)
Spoke w/ via phone for pre-op interview---Crystal Atkins needs dos----  urine pregnancy per anesthesia             Atkins results------08/31/21 Atkins appt for CBC, CMP (no type & screen need - pt refuses blood products) , 03/18/21 chest xray in Epic & chart COVID test -----patient states asymptomatic no test needed Arrive at -------0600 on Thursday, 09/03/2021 NPO after MN NO Solid Food.  Clear liquids from MN until---0500 Med rec completed Medications to take morning of surgery -----norethindrone Diabetic medication -----n/a Patient instructed no nail polish to be worn day of surgery Patient instructed to bring photo id and insurance card day of surgery Patient aware to have Driver (ride ) / caregiver    for 24 hours after surgery - friend, Doctor, general practice, daughter-caregiver Patient Special Instructions -----Extended / overnight stay instructions given. Pre-Op special Istructions -----Patient stated that she had no idea how much she weighted. She stated that she had a medium build. Weight check requested at pre-op Atkins appt. 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 Patient verbalized understanding of instructions that were given at this phone interview. Patient denies chest pain, fever, cough at this phone interview. Patient states that sometimes she gets a little SOB when she is anemic as of 08/26/21.  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 Patient was symptomatic with a cough. She states that she completed treatment for TB. Patient stated that her symptoms have resolved. Patient had Covid-19 in 02/2021 and a cxray was done. Chest x-ray was abnormal, see Cxray dated 03/18/21 in Epic & chart. I reviewed this case with Dr. Lollie Sails, MDA on 08/26/21. Marland Kitchen Per Dr. Lanetta Inch,  it is ok to proceed with surgery at [facility] since patient does not currently have respiratory symptoms.

## 2021-08-28 ENCOUNTER — Ambulatory Visit (INDEPENDENT_AMBULATORY_CARE_PROVIDER_SITE_OTHER): Payer: BC Managed Care – PPO

## 2021-08-28 ENCOUNTER — Other Ambulatory Visit: Payer: Self-pay

## 2021-08-28 VITALS — BP 125/74 | HR 59 | Temp 98.2°F | Resp 16 | Ht 62.0 in | Wt 131.4 lb

## 2021-08-28 DIAGNOSIS — N920 Excessive and frequent menstruation with regular cycle: Secondary | ICD-10-CM | POA: Diagnosis not present

## 2021-08-28 DIAGNOSIS — D500 Iron deficiency anemia secondary to blood loss (chronic): Secondary | ICD-10-CM | POA: Diagnosis not present

## 2021-08-28 DIAGNOSIS — D649 Anemia, unspecified: Secondary | ICD-10-CM

## 2021-08-28 MED ORDER — SODIUM CHLORIDE 0.9 % IV SOLN
500.0000 mg | Freq: Once | INTRAVENOUS | Status: AC
  Administered 2021-08-28: 500 mg via INTRAVENOUS
  Filled 2021-08-28: qty 25

## 2021-08-28 MED ORDER — DIPHENHYDRAMINE HCL 25 MG PO CAPS
25.0000 mg | ORAL_CAPSULE | Freq: Once | ORAL | Status: AC
  Administered 2021-08-28: 25 mg via ORAL
  Filled 2021-08-28: qty 1

## 2021-08-28 MED ORDER — ACETAMINOPHEN 325 MG PO TABS
650.0000 mg | ORAL_TABLET | Freq: Once | ORAL | Status: AC
  Administered 2021-08-28: 650 mg via ORAL
  Filled 2021-08-28: qty 2

## 2021-08-28 NOTE — Progress Notes (Signed)
Diagnosis: Iron Deficiency Anemia  Provider:  Marshell Garfinkel, MD  Procedure: Infusion  IV Type: Peripheral, IV Location: R Forearm  Venofer (Iron Sucrose), Dose: 500 mg  Infusion Start Time: 2423  Infusion Stop Time: 5361  Post Infusion IV Care: Observation period completed and Peripheral IV Discontinued  Discharge: Condition: Good, Destination: Home . AVS provided to patient.   Performed by:  Koren Shiver, RN

## 2021-08-28 NOTE — Progress Notes (Signed)
Patient presented to the [place] at [facility] on 6/1 stating she had an appointment at [facility].  Patient was advised to contact her provider or Elvina Sidle for further information. It appeared that she has an appointment on 6/6 at [facility].  Further investigation revealed that the appointment was at the [facility] [place].  I spoke with Shiela Mayer at the patient care center and informed her of the situation.  Caryl Pina stated they didn't have space on 6/2 and I shared that I would try to get the patient in at [facility].  We placed a therapy plan for prior authorization for [facility] and found that the turn around time for the authorization of the iron infusion at [facility] would be several days.  Therefore we would not be able to infuse that patient today 6/2. This information was provided to [place] at the [facility] [place].

## 2021-08-31 ENCOUNTER — Encounter (HOSPITAL_COMMUNITY)
Admission: RE | Admit: 2021-08-31 | Discharge: 2021-08-31 | Disposition: A | Discharge status: Home / Self Care | Payer: BC Managed Care – PPO | Source: Ambulatory Visit | Attending: Obstetrics and Gynecology | Admitting: Obstetrics and Gynecology

## 2021-08-31 DIAGNOSIS — Z01812 Encounter for preprocedural laboratory examination: Secondary | ICD-10-CM | POA: Insufficient documentation

## 2021-08-31 DIAGNOSIS — N8003 Adenomyosis of the uterus: Secondary | ICD-10-CM | POA: Insufficient documentation

## 2021-08-31 LAB — CBC
HCT: 33.1 % — ABNORMAL LOW (ref 36.0–46.0)
Hemoglobin: 10 g/dL — ABNORMAL LOW (ref 12.0–15.0)
MCH: 24.6 pg — ABNORMAL LOW (ref 26.0–34.0)
MCHC: 30.2 g/dL (ref 30.0–36.0)
MCV: 81.3 fL (ref 80.0–100.0)
Platelets: 401 10*3/uL — ABNORMAL HIGH (ref 150–400)
RBC: 4.07 MIL/uL (ref 3.87–5.11)
RDW: 15.9 % — ABNORMAL HIGH (ref 11.5–15.5)
WBC: 7.1 10*3/uL (ref 4.0–10.5)
nRBC: 0 % (ref 0.0–0.2)

## 2021-08-31 LAB — COMPREHENSIVE METABOLIC PANEL
ALT: 20 U/L (ref 0–44)
AST: 21 U/L (ref 15–41)
Albumin: 4 g/dL (ref 3.5–5.0)
Alkaline Phosphatase: 140 U/L — ABNORMAL HIGH (ref 38–126)
Anion gap: 5 (ref 5–15)
BUN: 7 mg/dL (ref 6–20)
CO2: 22 mmol/L (ref 22–32)
Calcium: 8.9 mg/dL (ref 8.9–10.3)
Chloride: 113 mmol/L — ABNORMAL HIGH (ref 98–111)
Creatinine, Ser: 0.81 mg/dL (ref 0.44–1.00)
GFR, Estimated: >60 mL/min (ref >60)
Glucose, Bld: 84 mg/dL (ref 70–99)
Potassium: 3.4 mmol/L — ABNORMAL LOW (ref 3.5–5.1)
Sodium: 140 mmol/L (ref 135–145)
Total Bilirubin: 0.3 mg/dL (ref 0.3–1.2)
Total Protein: 8.1 g/dL (ref 6.5–8.1)

## 2021-09-01 ENCOUNTER — Encounter (HOSPITAL_BASED_OUTPATIENT_CLINIC_OR_DEPARTMENT_OTHER): Payer: Self-pay | Admitting: Obstetrics and Gynecology

## 2021-09-01 ENCOUNTER — Inpatient Hospital Stay (HOSPITAL_COMMUNITY): Admission: RE | Admit: 2021-09-01 | Payer: BC Managed Care – PPO | Source: Ambulatory Visit

## 2021-09-01 NOTE — H&P (Addendum)
Crystal Atkins is an 51 y.o. female G39P2 with AUB for hysterectomy, B salpingectomy and cysto.  D/W pt r/b/a of surgery also process and expectations.  Pt is Jehovah's witness declines blood products - Hgb decreased 9.2-10, pt had iron infusion 6/2.  Has been on Aygestin to control bleeding, had benign EMB.  US reveals adenomyosis.    Pertinent Gynecological History: OB History: G2, V6720  No complications - SVD and LTCS  No abn pap No STDs  Menstrual History: Patient's last menstrual period was 08/22/2021 (exact date).    Past Medical History:  Diagnosis Date   Adenomyosis 07/29/2021   Anemia 2022   Endometriosis    History of TB (tuberculosis) 2020   Menorrhagia 07/29/2021   Tuberculosis 2020   Patient states that when she first came to the [place] from [place] she had a positive skin test, and was treated for tuberculosis. No current problems.    Past Surgical History:  Procedure Laterality Date   CESAREAN SECTION  2009    FH: HTN, prostate CA  Social History:  reports that she has never smoked. She has never used smokeless tobacco. She reports that she does not drink alcohol and does not use drugs. Single, in grad school at [employer].    Allergies:  Allergies  Allergen Reactions   Ginger Shortness Of Breath   Lactose Intolerance (Gi)    Other     NO BLOOD PRODUCTS. PATIENT IS A JEHOVAH'S WITNESS.    No medications prior to admission.    Review of Systems  Constitutional: Negative.   HENT: Negative.    Respiratory: Negative.    Cardiovascular: Negative.   Gastrointestinal: Negative.   Genitourinary:  Positive for menstrual problem.  Musculoskeletal: Negative.   Skin: Negative.   Neurological: Negative.   Psychiatric/Behavioral: Negative.     Weight 61.9 kg, last menstrual period 08/22/2021. Physical Exam Constitutional:      Appearance: Normal appearance. She is normal weight.  HENT:     Head: Normocephalic and atraumatic.  Cardiovascular:     Rate and Rhythm:  Normal rate and regular rhythm.  Pulmonary:     Effort: Pulmonary effort is normal.     Breath sounds: Normal breath sounds.  Abdominal:     General: Bowel sounds are normal.     Palpations: Abdomen is soft.  Genitourinary:    General: Normal vulva.     Rectum: Normal.  Musculoskeletal:        General: Normal range of motion.     Cervical back: Normal range of motion and neck supple.  Skin:    General: Skin is warm and dry.  Neurological:     General: No focal deficit present.     Mental Status: She is alert and oriented to person, place, and time.  Psychiatric:        Mood and Affect: Mood normal.        Behavior: Behavior normal.    Results for orders placed or performed during the hospital encounter of 08/31/21 (from the past 24 hour(s))  CBC     Status: Abnormal   Collection Time: 08/31/21 10:38 AM  Result Value Ref Range   WBC 7.1 4.0 - 10.5 K/uL   RBC 4.07 3.87 - 5.11 MIL/uL   Hemoglobin 10.0 (L) 12.0 - 15.0 g/dL   HCT 33.1 (L) 36.0 - 46.0 %   MCV 81.3 80.0 - 100.0 fL   MCH 24.6 (L) 26.0 - 34.0 pg   MCHC 30.2 30.0 -  36.0 g/dL   RDW 15.9 (H) 11.5 - 15.5 %   Platelets 401 (H) 150 - 400 K/uL   nRBC 0.0 0.0 - 0.2 %  Comprehensive metabolic panel     Status: Abnormal   Collection Time: 08/31/21 10:38 AM  Result Value Ref Range   Sodium 140 135 - 145 mmol/L   Potassium 3.4 (L) 3.5 - 5.1 mmol/L   Chloride 113 (H) 98 - 111 mmol/L   CO2 22 22 - 32 mmol/L   Glucose, Bld 84 70 - 99 mg/dL   BUN 7 6 - 20 mg/dL   Creatinine, Ser 0.81 0.44 - 1.00 mg/dL   Calcium 8.9 8.9 - 10.3 mg/dL   Total Protein 8.1 6.5 - 8.1 g/dL   Albumin 4.0 3.5 - 5.0 g/dL   AST 21 15 - 41 U/L   ALT 20 0 - 44 U/L   Alkaline Phosphatase 140 (H) 38 - 126 U/L   Total Bilirubin 0.3 0.3 - 1.2 mg/dL   GFR, Estimated >60 >60 mL/min   Anion gap 5 5 - 15    [place] adenomyosis EMB benign  Assessment/Plan: 51yo G2P2 with AUB to anemia with LAVH/BS/cysto.   D/w pt r/b/a Ancef for prophylaxis  Crystal Atkins  Crystal Atkins 09/01/2021, 9:15 AM

## 2021-09-01 NOTE — Anesthesia Preprocedure Evaluation (Addendum)
Anesthesia Evaluation  Patient identified by MRN, date of birth, ID band Patient awake    Reviewed: Allergy & Precautions, NPO status , Patient's Chart, lab work & pertinent test results  Airway Mallampati: II  TM Distance: >3 FB Neck ROM: Full    Dental  (+) Dental Advisory Given, Missing,    Pulmonary  Hx/o Pulmonary TB 2020 treated   Pulmonary exam normal breath sounds clear to auscultation       Cardiovascular negative cardio ROS Normal cardiovascular exam Rhythm:Regular Rate:Normal     Neuro/Psych negative neurological ROS  negative psych ROS   GI/Hepatic negative GI ROS, Neg liver ROS,   Endo/Other    Renal/GU negative Renal ROS  negative genitourinary   Musculoskeletal negative musculoskeletal ROS (+)   Abdominal   Peds  Hematology  (+) Blood dyscrasia, anemia , REFUSES BLOOD PRODUCTS,   Anesthesia Other Findings   Reproductive/Obstetrics Uterine fibroids Menorrhagia                             Anesthesia Physical Anesthesia Plan  ASA: 2  Anesthesia Plan: General   Post-op Pain Management: Precedex, Dilaudid IV, Ketamine IV* and Tylenol PO (pre-op)*   Induction: Intravenous  PONV Risk Score and Plan: 4 or greater and Treatment may vary due to age or medical condition, Scopolamine patch - Pre-op, Midazolam, Ondansetron and Dexamethasone  Airway Management Planned: Oral ETT  Additional Equipment: None  Intra-op Plan:   Post-operative Plan: Extubation in OR  Informed Consent: I have reviewed the patients History and Physical, chart, labs and discussed the procedure including the risks, benefits and alternatives for the proposed anesthesia with the patient or authorized representative who has indicated his/her understanding and acceptance.     Dental advisory given  Plan Discussed with: CRNA and Anesthesiologist  Anesthesia Plan Comments:        Anesthesia  Quick Evaluation

## 2021-09-03 ENCOUNTER — Encounter (HOSPITAL_BASED_OUTPATIENT_CLINIC_OR_DEPARTMENT_OTHER)
Admission: RE | Disposition: A | Discharge status: Home / Self Care | Payer: Self-pay | Source: Home / Self Care | Attending: Obstetrics and Gynecology

## 2021-09-03 ENCOUNTER — Other Ambulatory Visit: Payer: Self-pay

## 2021-09-03 ENCOUNTER — Observation Stay (HOSPITAL_BASED_OUTPATIENT_CLINIC_OR_DEPARTMENT_OTHER)
Admission: RE | Admit: 2021-09-03 | Discharge: 2021-09-04 | Disposition: A | Discharge status: Home / Self Care | Payer: BC Managed Care – PPO | Attending: Obstetrics and Gynecology | Admitting: Obstetrics and Gynecology

## 2021-09-03 ENCOUNTER — Ambulatory Visit (HOSPITAL_BASED_OUTPATIENT_CLINIC_OR_DEPARTMENT_OTHER): Payer: BC Managed Care – PPO | Admitting: Anesthesiology

## 2021-09-03 ENCOUNTER — Encounter (HOSPITAL_BASED_OUTPATIENT_CLINIC_OR_DEPARTMENT_OTHER): Payer: Self-pay | Admitting: Obstetrics and Gynecology

## 2021-09-03 DIAGNOSIS — N920 Excessive and frequent menstruation with regular cycle: Secondary | ICD-10-CM | POA: Diagnosis not present

## 2021-09-03 DIAGNOSIS — D259 Leiomyoma of uterus, unspecified: Secondary | ICD-10-CM | POA: Diagnosis not present

## 2021-09-03 DIAGNOSIS — Z90710 Acquired absence of both cervix and uterus: Secondary | ICD-10-CM

## 2021-09-03 DIAGNOSIS — D649 Anemia, unspecified: Secondary | ICD-10-CM | POA: Diagnosis not present

## 2021-09-03 DIAGNOSIS — N8003 Adenomyosis of the uterus: Secondary | ICD-10-CM | POA: Diagnosis not present

## 2021-09-03 DIAGNOSIS — Z01818 Encounter for other preprocedural examination: Secondary | ICD-10-CM

## 2021-09-03 DIAGNOSIS — N939 Abnormal uterine and vaginal bleeding, unspecified: Secondary | ICD-10-CM | POA: Diagnosis not present

## 2021-09-03 HISTORY — PX: CYSTOSCOPY: SHX5120

## 2021-09-03 HISTORY — PX: LAPAROSCOPIC VAGINAL HYSTERECTOMY WITH SALPINGECTOMY: SHX6680

## 2021-09-03 HISTORY — DX: Other forms of dyspnea: R06.09

## 2021-09-03 HISTORY — DX: Acquired absence of both cervix and uterus: Z90.710

## 2021-09-03 LAB — POCT PREGNANCY, URINE: Preg Test, Ur: NEGATIVE

## 2021-09-03 SURGERY — HYSTERECTOMY, VAGINAL, LAPAROSCOPY-ASSISTED, WITH SALPINGECTOMY
Anesthesia: General | Site: Bladder

## 2021-09-03 MED ORDER — OXYCODONE-ACETAMINOPHEN 5-325 MG PO TABS
ORAL_TABLET | ORAL | Status: AC
  Filled 2021-09-03: qty 2

## 2021-09-03 MED ORDER — SIMETHICONE 80 MG PO CHEW
CHEWABLE_TABLET | ORAL | Status: AC
  Filled 2021-09-03: qty 1

## 2021-09-03 MED ORDER — MEPERIDINE HCL 25 MG/ML IJ SOLN
12.5000 mg | Freq: Once | INTRAMUSCULAR | Status: AC
  Administered 2021-09-03: 12.5 mg via INTRAVENOUS

## 2021-09-03 MED ORDER — LIDOCAINE HCL (PF) 2 % IJ SOLN
INTRAMUSCULAR | Status: AC
  Filled 2021-09-03: qty 5

## 2021-09-03 MED ORDER — LACTATED RINGERS IV SOLN: INTRAVENOUS | Status: DC

## 2021-09-03 MED ORDER — KETAMINE HCL 50 MG/5ML IJ SOSY
PREFILLED_SYRINGE | INTRAMUSCULAR | Status: AC
  Filled 2021-09-03: qty 5

## 2021-09-03 MED ORDER — CEFAZOLIN SODIUM-DEXTROSE 2-4 GM/100ML-% IV SOLN
2.0000 g | INTRAVENOUS | Status: AC
  Administered 2021-09-03: 2 g via INTRAVENOUS

## 2021-09-03 MED ORDER — HYDROMORPHONE 1 MG/ML IV SOLN: INTRAVENOUS | Status: DC

## 2021-09-03 MED ORDER — AMISULPRIDE (ANTIEMETIC) 5 MG/2ML IV SOLN: 10.0000 mg | Freq: Once | INTRAVENOUS | Status: DC | PRN

## 2021-09-03 MED ORDER — LIDOCAINE HCL (CARDIAC) PF 100 MG/5ML IV SOSY
PREFILLED_SYRINGE | INTRAVENOUS | Status: DC | PRN
  Administered 2021-09-03: 60 mg via INTRAVENOUS

## 2021-09-03 MED ORDER — SIMETHICONE 80 MG PO CHEW
80.0000 mg | CHEWABLE_TABLET | Freq: Four times a day (QID) | ORAL | Status: DC | PRN
  Administered 2021-09-03: 80 mg via ORAL

## 2021-09-03 MED ORDER — ALUM & MAG HYDROXIDE-SIMETH 200-200-20 MG/5ML PO SUSP: 30.0000 mL | ORAL | Status: DC | PRN

## 2021-09-03 MED ORDER — GUAIFENESIN 100 MG/5ML PO LIQD
15.0000 mL | ORAL | Status: DC | PRN
  Filled 2021-09-03: qty 15

## 2021-09-03 MED ORDER — OXYCODONE-ACETAMINOPHEN 5-325 MG PO TABS
1.0000 | ORAL_TABLET | ORAL | Status: DC | PRN
  Administered 2021-09-03 – 2021-09-04 (×5): 2 via ORAL

## 2021-09-03 MED ORDER — MIDAZOLAM HCL 2 MG/2ML IJ SOLN
INTRAMUSCULAR | Status: AC
  Filled 2021-09-03: qty 2

## 2021-09-03 MED ORDER — SODIUM CHLORIDE 0.9 % IR SOLN
Status: DC | PRN
  Administered 2021-09-03: 100 mL

## 2021-09-03 MED ORDER — KETOROLAC TROMETHAMINE 30 MG/ML IJ SOLN
INTRAMUSCULAR | Status: DC | PRN
  Administered 2021-09-03: 30 mg via INTRAVENOUS

## 2021-09-03 MED ORDER — ONDANSETRON HCL 4 MG/2ML IJ SOLN
INTRAMUSCULAR | Status: AC
  Filled 2021-09-03: qty 2

## 2021-09-03 MED ORDER — MIDAZOLAM HCL 5 MG/5ML IJ SOLN
INTRAMUSCULAR | Status: DC | PRN
  Administered 2021-09-03: 2 mg via INTRAVENOUS

## 2021-09-03 MED ORDER — PROPOFOL 10 MG/ML IV BOLUS
INTRAVENOUS | Status: AC
  Filled 2021-09-03: qty 20

## 2021-09-03 MED ORDER — ONDANSETRON HCL 4 MG/2ML IJ SOLN: 4.0000 mg | Freq: Four times a day (QID) | INTRAMUSCULAR | Status: DC | PRN

## 2021-09-03 MED ORDER — HYDROMORPHONE HCL 1 MG/ML IJ SOLN
0.2000 mg | INTRAMUSCULAR | Status: DC | PRN
  Administered 2021-09-03 (×2): 0.6 mg via INTRAVENOUS

## 2021-09-03 MED ORDER — MENTHOL 3 MG MT LOZG: 1.0000 | LOZENGE | OROMUCOSAL | Status: DC | PRN

## 2021-09-03 MED ORDER — DEXAMETHASONE SODIUM PHOSPHATE 4 MG/ML IJ SOLN
INTRAMUSCULAR | Status: DC | PRN
  Administered 2021-09-03: 10 mg via INTRAVENOUS

## 2021-09-03 MED ORDER — VASOPRESSIN 20 UNIT/ML IV SOLN
INTRAVENOUS | Status: DC | PRN
  Administered 2021-09-03: 20 mL via SURGICAL_CAVITY

## 2021-09-03 MED ORDER — NALOXONE HCL 0.4 MG/ML IJ SOLN: 0.4000 mg | INTRAMUSCULAR | Status: DC | PRN

## 2021-09-03 MED ORDER — CEFAZOLIN SODIUM-DEXTROSE 2-4 GM/100ML-% IV SOLN
INTRAVENOUS | Status: AC
  Filled 2021-09-03: qty 100

## 2021-09-03 MED ORDER — FENTANYL CITRATE (PF) 250 MCG/5ML IJ SOLN
INTRAMUSCULAR | Status: AC
  Filled 2021-09-03: qty 5

## 2021-09-03 MED ORDER — SUGAMMADEX SODIUM 200 MG/2ML IV SOLN
INTRAVENOUS | Status: DC | PRN
  Administered 2021-09-03: 200 mg via INTRAVENOUS

## 2021-09-03 MED ORDER — HYDROMORPHONE HCL 1 MG/ML IJ SOLN
INTRAMUSCULAR | Status: AC
  Filled 2021-09-03: qty 1

## 2021-09-03 MED ORDER — SCOPOLAMINE 1 MG/3DAYS TD PT72
MEDICATED_PATCH | TRANSDERMAL | Status: AC
  Filled 2021-09-03: qty 1

## 2021-09-03 MED ORDER — PROPOFOL 10 MG/ML IV BOLUS
INTRAVENOUS | Status: DC | PRN
  Administered 2021-09-03: 70 mg via INTRAVENOUS
  Administered 2021-09-03: 130 mg via INTRAVENOUS

## 2021-09-03 MED ORDER — ROCURONIUM BROMIDE 10 MG/ML (PF) SYRINGE
PREFILLED_SYRINGE | INTRAVENOUS | Status: AC
  Filled 2021-09-03: qty 10

## 2021-09-03 MED ORDER — SODIUM CHLORIDE 0.9 % IR SOLN
Status: DC | PRN
  Administered 2021-09-03: 200 mL via INTRAVESICAL

## 2021-09-03 MED ORDER — OXYCODONE HCL 5 MG/5ML PO SOLN: 5.0000 mg | Freq: Once | ORAL | Status: DC | PRN

## 2021-09-03 MED ORDER — ONDANSETRON HCL 4 MG/2ML IJ SOLN
INTRAMUSCULAR | Status: DC | PRN
  Administered 2021-09-03: 4 mg via INTRAVENOUS

## 2021-09-03 MED ORDER — 0.9 % SODIUM CHLORIDE (POUR BTL) OPTIME
TOPICAL | Status: DC | PRN
  Administered 2021-09-03: 500 mL

## 2021-09-03 MED ORDER — SCOPOLAMINE 1 MG/3DAYS TD PT72
1.0000 | MEDICATED_PATCH | TRANSDERMAL | Status: DC
Start: 2021-09-03 — End: 2021-09-03
  Administered 2021-09-03: 1.5 mg via TRANSDERMAL

## 2021-09-03 MED ORDER — DIPHENHYDRAMINE HCL 12.5 MG/5ML PO ELIX: 12.5000 mg | ORAL_SOLUTION | Freq: Four times a day (QID) | ORAL | Status: DC | PRN

## 2021-09-03 MED ORDER — IBUPROFEN 800 MG PO TABS
800.0000 mg | ORAL_TABLET | Freq: Three times a day (TID) | ORAL | Status: DC | PRN
  Administered 2021-09-03 – 2021-09-04 (×2): 800 mg via ORAL

## 2021-09-03 MED ORDER — ONDANSETRON HCL 4 MG PO TABS: 4.0000 mg | ORAL_TABLET | Freq: Four times a day (QID) | ORAL | Status: DC | PRN

## 2021-09-03 MED ORDER — KETAMINE HCL 10 MG/ML IJ SOLN
INTRAMUSCULAR | Status: DC | PRN
  Administered 2021-09-03 (×5): 10 mg via INTRAVENOUS

## 2021-09-03 MED ORDER — SODIUM CHLORIDE 0.9% FLUSH: 9.0000 mL | INTRAVENOUS | Status: DC | PRN

## 2021-09-03 MED ORDER — DIPHENHYDRAMINE HCL 50 MG/ML IJ SOLN: 12.5000 mg | Freq: Four times a day (QID) | INTRAMUSCULAR | Status: DC | PRN

## 2021-09-03 MED ORDER — ROCURONIUM BROMIDE 100 MG/10ML IV SOLN
INTRAVENOUS | Status: DC | PRN
  Administered 2021-09-03: 80 mg via INTRAVENOUS

## 2021-09-03 MED ORDER — IBUPROFEN 800 MG PO TABS
ORAL_TABLET | ORAL | Status: AC
  Filled 2021-09-03: qty 1

## 2021-09-03 MED ORDER — POVIDONE-IODINE 10 % EX SWAB
2.0000 "application " | Freq: Once | CUTANEOUS | Status: AC
  Administered 2021-09-03: 2 via TOPICAL

## 2021-09-03 MED ORDER — MEPERIDINE HCL 25 MG/ML IJ SOLN
INTRAMUSCULAR | Status: AC
  Filled 2021-09-03: qty 1

## 2021-09-03 MED ORDER — ONDANSETRON HCL 4 MG/2ML IJ SOLN: 4.0000 mg | Freq: Once | INTRAMUSCULAR | Status: DC | PRN

## 2021-09-03 MED ORDER — BUPIVACAINE HCL (PF) 0.25 % IJ SOLN
INTRAMUSCULAR | Status: DC | PRN
  Administered 2021-09-03: 10 mL

## 2021-09-03 MED ORDER — DEXAMETHASONE SODIUM PHOSPHATE 10 MG/ML IJ SOLN
INTRAMUSCULAR | Status: AC
  Filled 2021-09-03: qty 1

## 2021-09-03 MED ORDER — FENTANYL CITRATE (PF) 100 MCG/2ML IJ SOLN
INTRAMUSCULAR | Status: DC | PRN
  Administered 2021-09-03: 100 ug via INTRAVENOUS
  Administered 2021-09-03 (×3): 50 ug via INTRAVENOUS

## 2021-09-03 MED ORDER — OXYCODONE HCL 5 MG PO TABS: 5.0000 mg | ORAL_TABLET | Freq: Once | ORAL | Status: DC | PRN

## 2021-09-03 MED ORDER — HYDROMORPHONE HCL 1 MG/ML IJ SOLN: 0.2500 mg | INTRAMUSCULAR | Status: DC | PRN

## 2021-09-03 SURGICAL SUPPLY — 52 items
ADH SKN CLS APL DERMABOND .7 (GAUZE/BANDAGES/DRESSINGS) ×2
APL SRG 38 LTWT LNG FL B (MISCELLANEOUS)
APPLICATOR ARISTA FLEXITIP XL (MISCELLANEOUS) IMPLANT
BAG DECANTER FOR FLEXI CONT (MISCELLANEOUS) ×1 IMPLANT
CABLE HIGH FREQUENCY MONO STRZ (ELECTRODE) IMPLANT
CNTNR URN SCR LID CUP LEK RST (MISCELLANEOUS) ×2 IMPLANT
CONT SPEC 4OZ STRL OR WHT (MISCELLANEOUS) ×3
COVER BACK TABLE 60X90IN (DRAPES) ×3 IMPLANT
COVER MAYO STAND STRL (DRAPES) ×6 IMPLANT
DECANTER SPIKE VIAL GLASS SM (MISCELLANEOUS) IMPLANT
DERMABOND ADVANCED (GAUZE/BANDAGES/DRESSINGS) ×1
DERMABOND ADVANCED .7 DNX12 (GAUZE/BANDAGES/DRESSINGS) ×2 IMPLANT
DRSG COVADERM PLUS 2X2 (GAUZE/BANDAGES/DRESSINGS) IMPLANT
DRSG OPSITE POSTOP 3X4 (GAUZE/BANDAGES/DRESSINGS) IMPLANT
DURAPREP 26ML APPLICATOR (WOUND CARE) ×3 IMPLANT
ELECT REM PT RETURN 9FT ADLT (ELECTROSURGICAL) ×3
ELECTRODE REM PT RTRN 9FT ADLT (ELECTROSURGICAL) ×2 IMPLANT
GAUZE 4X4 16PLY ~~LOC~~+RFID DBL (SPONGE) ×9 IMPLANT
GLOVE BIO SURGEON STRL SZ 6.5 (GLOVE) ×9 IMPLANT
HEMOSTAT ARISTA ABSORB 3G PWDR (HEMOSTASIS) IMPLANT
HIBICLENS CHG 4% 4OZ BTL (MISCELLANEOUS) ×3 IMPLANT
HOLDER FOLEY CATH W/STRAP (MISCELLANEOUS) ×1 IMPLANT
IV NS 1000ML (IV SOLUTION) ×6
IV NS 1000ML BAXH (IV SOLUTION) IMPLANT
KIT TURNOVER CYSTO (KITS) ×3 IMPLANT
NEEDLE INSUFFLATION 120MM (ENDOMECHANICALS) ×3 IMPLANT
NS IRRIG 1000ML POUR BTL (IV SOLUTION) ×2 IMPLANT
NS IRRIG 500ML POUR BTL (IV SOLUTION) ×1 IMPLANT
PACK LAVH (CUSTOM PROCEDURE TRAY) ×3 IMPLANT
PACK ROBOTIC GOWN (GOWN DISPOSABLE) ×3 IMPLANT
PACK TRENDGUARD 450 HYBRID PRO (MISCELLANEOUS) ×2 IMPLANT
PAD OB MATERNITY 4.3X12.25 (PERSONAL CARE ITEMS) ×3 IMPLANT
PROTECTOR NERVE ULNAR (MISCELLANEOUS) ×4 IMPLANT
SET IRRIG Y TYPE TUR BLADDER L (SET/KITS/TRAYS/PACK) ×3 IMPLANT
SET SUCTION IRRIG HYDROSURG (IRRIGATION / IRRIGATOR) ×3 IMPLANT
SET TUBE SMOKE EVAC HIGH FLOW (TUBING) ×3 IMPLANT
SHEARS HARMONIC ACE PLUS 36CM (ENDOMECHANICALS) ×3 IMPLANT
SPONGE T-LAP 4X18 ~~LOC~~+RFID (SPONGE) ×3 IMPLANT
SUT VIC AB 1 CT1 18XBRD ANBCTR (SUTURE) ×4 IMPLANT
SUT VIC AB 1 CT1 8-18 (SUTURE) ×6
SUT VIC AB 2-0 CT1 (SUTURE) ×3 IMPLANT
SUT VIC AB 3-0 SH 27 (SUTURE)
SUT VIC AB 3-0 SH 27X BRD (SUTURE) IMPLANT
SUT VIC AB 4-0 PS2 27 (SUTURE) ×3 IMPLANT
SUT VICRYL 0 TIES 12 18 (SUTURE) ×3 IMPLANT
SUT VICRYL 0 UR6 27IN ABS (SUTURE) IMPLANT
SYR 3ML 23GX1 SAFETY (SYRINGE) ×1 IMPLANT
TOWEL OR 17X26 10 PK STRL BLUE (TOWEL DISPOSABLE) ×3 IMPLANT
TRAY FOLEY W/BAG SLVR 14FR LF (SET/KITS/TRAYS/PACK) ×3 IMPLANT
TRENDGUARD 450 HYBRID PRO PACK (MISCELLANEOUS) ×3
TROCAR BLADELESS OPT 5 100 (ENDOMECHANICALS) ×9 IMPLANT
WARMER LAPAROSCOPE (MISCELLANEOUS) ×3 IMPLANT

## 2021-09-03 NOTE — Progress Notes (Signed)
Day of Surgery Procedure(s) (LRB): LAPAROSCOPIC ASSISTED VAGINAL HYSTERECTOMY WITH SALPINGECTOMY (Bilateral) CYSTOSCOPY (N/A)  Subjective: Patient reports nausea and incisional pain.  Pain controlled.  Encourage ambulation  Objective: I have reviewed patient's vital signs, intake and output, and medications.  General: alert, cooperative, and no distress Resp: clear to auscultation bilaterally Cardio: regular rate and rhythm GI: soft, non-tender; bowel sounds normal; no masses,  no organomegaly and incision: clean, dry, and intact Extremities: extremities normal, atraumatic, no cyanosis or edema  Assessment: s/p Procedure(s): LAPAROSCOPIC ASSISTED VAGINAL HYSTERECTOMY WITH SALPINGECTOMY (Bilateral) CYSTOSCOPY (N/A): stable and progressing well  Plan: Advance diet Encourage ambulation As able d/c foley, advance to po meds Plan for d/c in AM.   D/C with Motrin and percocet.  F/u 2 and 6 weeks  LOS: 0 days    Crystal Atkins 09/03/2021, 4:59 PM

## 2021-09-03 NOTE — Anesthesia Procedure Notes (Signed)
Procedure Name: Intubation Date/Time: 09/03/2021 8:03 AM  Performed by: Justice Rocher, CRNAPre-anesthesia Checklist: Patient identified, Emergency Drugs available, Suction available, Patient being monitored and Timeout performed Patient Re-evaluated:Patient Re-evaluated prior to induction Oxygen Delivery Method: Circle system utilized Preoxygenation: Pre-oxygenation with 100% oxygen Induction Type: IV induction Ventilation: Mask ventilation without difficulty Laryngoscope Size: Mac and 3 Grade View: Grade II Tube type: Oral Tube size: 7.0 mm Number of attempts: 1 Airway Equipment and Method: Stylet and Oral airway Placement Confirmation: ETT inserted through vocal cords under direct vision, positive ETCO2, breath sounds checked- equal and bilateral and CO2 detector Secured at: 23 cm Tube secured with: Tape Dental Injury: Teeth and Oropharynx as per pre-operative assessment

## 2021-09-03 NOTE — Interval H&P Note (Signed)
History and Physical Interval Note:  09/03/2021 7:37 AM  Crystal Atkins  has presented today for surgery, with the diagnosis of menorrhagia.  The various methods of treatment have been discussed with the patient and family. After consideration of risks, benefits and other options for treatment, the patient has consented to  Procedure(s): LAPAROSCOPIC ASSISTED VAGINAL HYSTERECTOMY WITH SALPINGECTOMY (Bilateral) CYSTOSCOPY (N/A) as a surgical intervention.  The patient's history has been reviewed, patient examined, no change in status, stable for surgery.  I have reviewed the patient's chart and labs.  Questions were answered to the patient's satisfaction.     Crystal Atkins

## 2021-09-03 NOTE — Brief Op Note (Signed)
09/03/2021  10:04 AM  PATIENT:  Crystal Atkins  51 y.o. female  PRE-OPERATIVE DIAGNOSIS:  menorrhagia, AUB, adenomyosis  POST-OPERATIVE DIAGNOSIS:  menorrhagia, AUB, adenomyosis  PROCEDURE:  Procedure(s): LAPAROSCOPIC ASSISTED VAGINAL HYSTERECTOMY WITH SALPINGECTOMY (Bilateral) CYSTOSCOPY (N/A)  SURGEON:  Surgeon(s) and Role:    * [name], Legend Pecore, MD - Primary    * Banga, Bonnee Quin, DO - Assisting  ANESTHESIA:   local and general  EBL:  100 mL IVF and uop per anesthesia  BLOOD ADMINISTERED:none  DRAINS: Urinary Catheter (Foley)   LOCAL MEDICATIONS USED:  MARCAINE     SPECIMEN:  Source of Specimen:  uterus, cervix, B tubes  DISPOSITION OF SPECIMEN:  PATHOLOGY  COUNTS:  YES  TOURNIQUET:  * No tourniquets in log *  DICTATION: .Other Dictation: Dictation Number [id number]  PLAN OF CARE: Admit for overnight observation  PATIENT DISPOSITION:  PACU - hemodynamically stable.   Delay start of Pharmacological VTE agent (>24hrs) due to surgical blood loss or risk of bleeding: not applicable

## 2021-09-03 NOTE — Op Note (Signed)
NAMERAYLEA, Crystal Atkins MEDICAL RECORD NO: [id number] ACCOUNT NO: [id number] DATE OF BIRTH: 1970/04/20 FACILITY: [facility] LOCATION: [facility] PHYSICIAN: Sherian Rein, MD  Operative Report   DATE OF PROCEDURE: 09/03/2021  PREOPERATIVE DIAGNOSIS:  Menorrhagia, anemia, abnormal uterine bleeding, adenomyosis.  POSTOPERATIVE DIAGNOSES: Menorrhagia, anemia, abnormal uterine bleeding, adenomyosis, status post laparoscopic-assisted vaginal hysterectomy.  PROCEDURE:  Laparoscopic-assisted vaginal hysterectomy with bilateral salpingectomy and cystoscopy.  SURGEON: Sherian Rein, MD  ASSISTANT:  Pryor Ochoa, DO  ANESTHESIA:  Local and general.  ESTIMATED BLOOD LOSS:  100 mL  IV FLUID AND URINE OUTPUT:  Per Anesthesia.  Clear urine at the end of the procedure.  CONSULTATIONS:  None.  PATHOLOGY:  Uterus, cervix, bilateral tubes.  DESCRIPTION OF PROCEDURE:  After informed consent was reviewed, with the patient including risks, benefits and alternatives of the surgical procedure, she was transferred to the operating room and placed under general anesthesia and then when she was  fully anesthetized, she was placed in the [place] stirrups.  After an appropriate timeout, she was prepped and draped and an open-sided speculum was used to visualize her cervix.  A Hulka manipulator was placed.  A Foley catheter was placed in her  bladder.  The attention was turned to the abdominal portion of the case.  Gloves and gowns were changed.  An approximately 5 mm infraumbilical incision vertically was made, using a Veress needle, pneumoperitoneum was obtained with an opening pressure of  0 mmHg.  The trocar was placed.  Accessory trocars were placed on both the right and left under direct visualization.  A pelvic survey was performed revealing somewhat enlarged bulky-appearing uterus consistent with the diagnosis of adenomyosis, normal  tubes and ovaries were also noted.  Her left tube was grasped  at the fimbriated end and extended.  The tube was excised to the level of the cornu.  The uterus was then isolated to the level of the uterine artery with Harmonic scalpel.  The bladder flap  was created.  Attention was then turned to the right side, which in a similar fashion was grasped at the fimbriated end and extended and excised to the level of the cornu with Harmonic scalpel.  The uterus was then isolated using Harmonic scalpel to the  level of the uterine artery.  The bladder flap was connected to the bladder flap from the left, instruments were removed.  Attention was turned to the vaginal portion of the case.  The uterus was then injected with 20 mL of vasopressin and circumscribed  with Bovie cautery.  The anterior cul-de-sac was entered sharply.  The posterior cul-de-sac was then entered sharply as well.  The uterosacral ligament was ligated bilaterally and held.  Attention was turned to the sutures to connect to the level of the  uterine artery through the broad and cardinal ligaments, the cervix was then isolated and the uterus was flipped and delivered, was noted to be hemostatic.  The posterior cuff was reefed. The McCall suture was placed for uterosacral ligament plication.  The held sutures were tied  together.  The cuff was closed with 2-0 Vicryl in a running locked fashion.  Cystoscopy was performed revealing bilateral uterine jets, no stitches in the bladder.  Foley catheter was replaced.  Gloves and gown were changed.  Attention was turned back  to the abdominal portion of the case.  Brief pelvic survey performed revealed no obvious bleeding and the trocars were removed under direct visualization.  The trocar sites were closed with 3-0 Vicryl in a  subcuticular fashion and Dermabond.  The suture at the umbilicus had a deep suture as  well of 3-0 Vicryl.  The patient tolerated the procedure well.  Sponge, lap, and needle count was correct x2 per the operating room staff.   PUS D:  09/03/2021 10:21:07 am T: 09/03/2021 1:00:00 pm  JOB: [id number]/ [id number]

## 2021-09-03 NOTE — Transfer of Care (Signed)
Immediate Anesthesia Transfer of Care Note  Patient: Crystal Atkins  Procedure(s) Performed: Procedure(s) (LRB): LAPAROSCOPIC ASSISTED VAGINAL HYSTERECTOMY WITH SALPINGECTOMY (Bilateral) CYSTOSCOPY (N/A)  Patient Location: PACU  Anesthesia Type: General  Level of Consciousness: awake, sedated, patient cooperative and responds to stimulation  Airway & Oxygen Therapy: Patient Spontanous Breathing and Patient connected to [place] 02   Post-op Assessment: Report given to PACU RN, Post -op Vital signs reviewed and stable and Patient moving all extremities  Post vital signs: Reviewed and stable  Complications: No apparent anesthesia complications

## 2021-09-03 NOTE — Anesthesia Postprocedure Evaluation (Signed)
Anesthesia Post Note  Patient: Crystal Atkins  Procedure(s) Performed: LAPAROSCOPIC ASSISTED VAGINAL HYSTERECTOMY WITH SALPINGECTOMY (Bilateral: Abdomen) CYSTOSCOPY (Bladder)     Patient location during evaluation: PACU Anesthesia Type: General Level of consciousness: awake and alert and oriented Pain management: pain level controlled Vital Signs Assessment: post-procedure vital signs reviewed and stable Respiratory status: spontaneous breathing, nonlabored ventilation and respiratory function stable Cardiovascular status: blood pressure returned to baseline and stable Postop Assessment: no apparent nausea or vomiting Anesthetic complications: no   No notable events documented.  Last Vitals:  Vitals:   09/03/21 1030 09/03/21 1045  BP: 131/82 137/84  Pulse: 79 68  Resp: 14 17  Temp: (!) 35.9 C (!) 36.3 C  SpO2: 94% 100%    Last Pain:  Vitals:   09/03/21 1045  TempSrc:   PainSc: 0-No pain                 [name] A.

## 2021-09-04 DIAGNOSIS — D259 Leiomyoma of uterus, unspecified: Secondary | ICD-10-CM | POA: Diagnosis not present

## 2021-09-04 DIAGNOSIS — N8003 Adenomyosis of the uterus: Secondary | ICD-10-CM | POA: Diagnosis not present

## 2021-09-04 DIAGNOSIS — D649 Anemia, unspecified: Secondary | ICD-10-CM | POA: Diagnosis not present

## 2021-09-04 DIAGNOSIS — N920 Excessive and frequent menstruation with regular cycle: Secondary | ICD-10-CM | POA: Diagnosis not present

## 2021-09-04 LAB — CBC
HCT: 27.9 % — ABNORMAL LOW (ref 36.0–46.0)
Hemoglobin: 8.4 g/dL — ABNORMAL LOW (ref 12.0–15.0)
MCH: 25.2 pg — ABNORMAL LOW (ref 26.0–34.0)
MCHC: 30.1 g/dL (ref 30.0–36.0)
MCV: 83.8 fL (ref 80.0–100.0)
Platelets: 294 10*3/uL (ref 150–400)
RBC: 3.33 MIL/uL — ABNORMAL LOW (ref 3.87–5.11)
RDW: 18.6 % — ABNORMAL HIGH (ref 11.5–15.5)
WBC: 14.4 10*3/uL — ABNORMAL HIGH (ref 4.0–10.5)
nRBC: 0 % (ref 0.0–0.2)

## 2021-09-04 LAB — BASIC METABOLIC PANEL
Anion gap: 5 (ref 5–15)
BUN: 8 mg/dL (ref 6–20)
CO2: 20 mmol/L — ABNORMAL LOW (ref 22–32)
Calcium: 8.4 mg/dL — ABNORMAL LOW (ref 8.9–10.3)
Chloride: 111 mmol/L (ref 98–111)
Creatinine, Ser: 0.78 mg/dL (ref 0.44–1.00)
GFR, Estimated: >60 mL/min (ref >60)
Glucose, Bld: 132 mg/dL — ABNORMAL HIGH (ref 70–99)
Potassium: 4.3 mmol/L (ref 3.5–5.1)
Sodium: 136 mmol/L (ref 135–145)

## 2021-09-04 MED ORDER — OXYCODONE-ACETAMINOPHEN 5-325 MG PO TABS: 1.0000 | ORAL_TABLET | Freq: Four times a day (QID) | ORAL | 0 refills | Status: AC | PRN

## 2021-09-04 MED ORDER — OXYCODONE-ACETAMINOPHEN 5-325 MG PO TABS
ORAL_TABLET | ORAL | Status: AC
  Filled 2021-09-04: qty 2

## 2021-09-04 MED ORDER — NAPROXEN 500 MG PO TABS
500.0000 mg | ORAL_TABLET | Freq: Two times a day (BID) | ORAL | 1 refills | Status: AC | PRN
Start: 2021-09-04 — End: ?

## 2021-09-04 MED ORDER — IBUPROFEN 800 MG PO TABS
ORAL_TABLET | ORAL | Status: AC
  Filled 2021-09-04: qty 1

## 2021-09-04 NOTE — Progress Notes (Signed)
1 Day Post-Op Procedure(s) (LRB): LAPAROSCOPIC ASSISTED VAGINAL HYSTERECTOMY WITH SALPINGECTOMY (Bilateral) CYSTOSCOPY (N/A)  Subjective: Patient reports nausea, incisional pain, tolerating PO, and no problems voiding.    Objective: I have reviewed patient's vital signs, intake and output, and medications.  General: alert, cooperative, and no distress Resp: clear to auscultation bilaterally Cardio: regular rate and rhythm GI: soft, non-tender; bowel sounds normal; no masses,  no organomegaly and incision: clean, dry, and intact Extremities: extremities normal, atraumatic, no cyanosis or edema  Assessment: s/p Procedure(s): LAPAROSCOPIC ASSISTED VAGINAL HYSTERECTOMY WITH SALPINGECTOMY (Bilateral) CYSTOSCOPY (N/A): stable and progressing well  Plan: Encourage ambulation Advance to PO medication  Plan for discharge when ambulating, voiding, tolerating po and pain controlled D/c with Motrin, percocet.  F/u 2 and 6 eeks  LOS: 0 days    Crystal Atkins 09/04/2021, 7:38 AM

## 2021-09-04 NOTE — Discharge Summary (Signed)
Physician Discharge Summary  Patient ID: Crystal Atkins MRN: [id number] DOB/AGE: 1970-10-24 51 y.o.  Admit date: 09/03/2021 Discharge date: 09/04/2021  Admission Diagnoses: AUB,, adenomyosis, menorrhagia  Discharge Diagnoses:  Principal Problem:   S/P laparoscopic assisted vaginal hysterectomy (LAVH) Active Problems:   Adenomyosis   Menorrhagia   Discharged Condition: good  Hospital Course: Admitted for surgery 6/8 - underwent LAVH/BS/cysto w/o complication.  Postoperative course uncomplicated and labs stable POD#1.  Ambulating, voiding, tolerating PO.  Pain controlled.  D/C with naproxen and percocet.   Consults: None  Significant Diagnostic Studies: labs: CBC, BMP  Treatments: IV hydration, analgesia: Dilaudid and percocet, and surgery: LAVH/BS  Discharge Exam: Blood pressure 117/78, pulse 73, temperature (!) 97.4 F (36.3 C), resp. rate 16, height '5\' 2"'$  (1.575 m), weight 59.9 kg, last menstrual period 08/22/2021, SpO2 100 %. General appearance: alert and no distress Resp: clear to auscultation bilaterally Cardio: regular rate and rhythm GI: soft, non-tender; bowel sounds normal; no masses,  no organomegaly Extremities: extremities normal, atraumatic, no cyanosis or edema Incision/Wound: C/D/I  Disposition: Discharge disposition: 01-Home or Self Care       Discharge Instructions     Call MD for:  persistant nausea and vomiting   Complete by: As directed    Call MD for:  redness, tenderness, or signs of infection (pain, swelling, redness, odor or green/yellow discharge around incision site)   Complete by: As directed    Call MD for:  severe uncontrolled pain   Complete by: As directed    Diet - low sodium heart healthy   Complete by: As directed    Discharge instructions   Complete by: As directed    Call [phone] with questions or problems   Driving Restrictions   Complete by: As directed    While taking strong pain medicine   Increase activity slowly    Complete by: As directed    Lifting restrictions   Complete by: As directed    No greater than 10-15lbs for 6 weeks   May shower / Bathe   Complete by: As directed    May walk up steps   Complete by: As directed    No wound care   Complete by: As directed    Glue will peel from incisions - you may see part of sutures   Sexual Activity Restrictions   Complete by: As directed    Pelvic rest - no douching, tampons or sex for 6 weeks      Allergies as of 09/04/2021       Reactions   Ginger Shortness Of Breath   Lactose Intolerance (gi)    Other    NO BLOOD PRODUCTS. PATIENT IS A JEHOVAH'S WITNESS.        Medication List     STOP taking these medications    norethindrone 5 MG tablet Commonly known as: AYGESTIN       TAKE these medications    naproxen 500 MG tablet Commonly known as: NAPROSYN Take 1 tablet (500 mg total) by mouth 2 (two) times daily as needed for moderate pain. What changed:  medication strength how much to take reasons to take this   oxyCODONE-acetaminophen 5-325 MG tablet Commonly known as: PERCOCET/ROXICET Take 1-2 tablets by mouth every 6 (six) hours as needed for severe pain.        Follow-up Information     [name], Ota Ebersole, MD. Schedule an appointment as soon as possible for a visit.   Specialty: Obstetrics and Gynecology Why: 2  and 6 eeks for surgical follow-up Contact information: [address] [place] [address] [phone]                 Signed: Janyth Contes 09/04/2021, 8:31 AM

## 2021-09-07 ENCOUNTER — Encounter (HOSPITAL_BASED_OUTPATIENT_CLINIC_OR_DEPARTMENT_OTHER): Payer: Self-pay | Admitting: Obstetrics and Gynecology

## 2021-09-07 LAB — SURGICAL PATHOLOGY

## 2021-09-17 DIAGNOSIS — N939 Abnormal uterine and vaginal bleeding, unspecified: Secondary | ICD-10-CM | POA: Diagnosis not present

## 2022-07-15 DIAGNOSIS — Z1151 Encounter for screening for human papillomavirus (HPV): Secondary | ICD-10-CM | POA: Diagnosis not present

## 2022-07-15 DIAGNOSIS — Z1231 Encounter for screening mammogram for malignant neoplasm of breast: Secondary | ICD-10-CM | POA: Diagnosis not present

## 2022-07-15 DIAGNOSIS — Z1389 Encounter for screening for other disorder: Secondary | ICD-10-CM | POA: Diagnosis not present

## 2022-07-15 DIAGNOSIS — Z01419 Encounter for gynecological examination (general) (routine) without abnormal findings: Secondary | ICD-10-CM | POA: Diagnosis not present

## 2022-07-15 DIAGNOSIS — Z124 Encounter for screening for malignant neoplasm of cervix: Secondary | ICD-10-CM | POA: Diagnosis not present

## 2023-01-04 DIAGNOSIS — H6993 Unspecified Eustachian tube disorder, bilateral: Secondary | ICD-10-CM | POA: Diagnosis not present

## 2023-05-21 DIAGNOSIS — R12 Heartburn: Secondary | ICD-10-CM | POA: Diagnosis not present

## 2023-05-21 DIAGNOSIS — K219 Gastro-esophageal reflux disease without esophagitis: Secondary | ICD-10-CM | POA: Diagnosis not present

## 2023-05-21 DIAGNOSIS — R1013 Epigastric pain: Secondary | ICD-10-CM | POA: Diagnosis not present

## 2023-06-13 DIAGNOSIS — R09A2 Foreign body sensation, throat: Secondary | ICD-10-CM | POA: Diagnosis not present

## 2023-06-13 DIAGNOSIS — R059 Cough, unspecified: Secondary | ICD-10-CM | POA: Diagnosis not present

## 2023-06-13 DIAGNOSIS — K219 Gastro-esophageal reflux disease without esophagitis: Secondary | ICD-10-CM | POA: Diagnosis not present

## 2023-06-13 DIAGNOSIS — R5383 Other fatigue: Secondary | ICD-10-CM | POA: Diagnosis not present

## 2023-06-13 DIAGNOSIS — Z131 Encounter for screening for diabetes mellitus: Secondary | ICD-10-CM | POA: Diagnosis not present

## 2023-06-13 DIAGNOSIS — G4700 Insomnia, unspecified: Secondary | ICD-10-CM | POA: Diagnosis not present

## 2023-07-07 DIAGNOSIS — H16143 Punctate keratitis, bilateral: Secondary | ICD-10-CM | POA: Diagnosis not present

## 2023-07-19 DIAGNOSIS — H16143 Punctate keratitis, bilateral: Secondary | ICD-10-CM | POA: Diagnosis not present

## 2023-07-19 DIAGNOSIS — D3131 Benign neoplasm of right choroid: Secondary | ICD-10-CM | POA: Diagnosis not present

## 2023-07-19 DIAGNOSIS — H40013 Open angle with borderline findings, low risk, bilateral: Secondary | ICD-10-CM | POA: Diagnosis not present

## 2023-08-01 DIAGNOSIS — Z130 Encounter for screening for diseases of the blood and blood-forming organs and certain disorders involving the immune mechanism: Secondary | ICD-10-CM | POA: Diagnosis not present

## 2023-08-01 DIAGNOSIS — Z1231 Encounter for screening mammogram for malignant neoplasm of breast: Secondary | ICD-10-CM | POA: Diagnosis not present

## 2023-08-01 DIAGNOSIS — Z01419 Encounter for gynecological examination (general) (routine) without abnormal findings: Secondary | ICD-10-CM | POA: Diagnosis not present

## 2023-08-29 DIAGNOSIS — J0140 Acute pansinusitis, unspecified: Secondary | ICD-10-CM | POA: Diagnosis not present

## 2023-08-29 DIAGNOSIS — R051 Acute cough: Secondary | ICD-10-CM | POA: Diagnosis not present

## 2023-08-29 DIAGNOSIS — J029 Acute pharyngitis, unspecified: Secondary | ICD-10-CM | POA: Diagnosis not present

## 2023-08-29 DIAGNOSIS — Z6822 Body mass index (BMI) 22.0-22.9, adult: Secondary | ICD-10-CM | POA: Diagnosis not present
